# Patient Record
Sex: Female | Born: 1945 | Race: Black or African American | Hispanic: No | Smoking: Former smoker
Health system: Southern US, Community
[De-identification: ages and names within clinical notes are randomized; demographics above are authoritative.]

## PROBLEM LIST (undated history)

## (undated) DIAGNOSIS — L509 Urticaria, unspecified: Secondary | ICD-10-CM

## (undated) DIAGNOSIS — I10 Essential (primary) hypertension: Secondary | ICD-10-CM

## (undated) DIAGNOSIS — K219 Gastro-esophageal reflux disease without esophagitis: Secondary | ICD-10-CM

## (undated) DIAGNOSIS — E7800 Pure hypercholesterolemia, unspecified: Secondary | ICD-10-CM

## (undated) DIAGNOSIS — IMO0001 Reserved for inherently not codable concepts without codable children: Secondary | ICD-10-CM

## (undated) HISTORY — DX: Gastro-esophageal reflux disease without esophagitis: K21.9

## (undated) HISTORY — DX: Essential (primary) hypertension: I10

## (undated) HISTORY — DX: Pure hypercholesterolemia, unspecified: E78.00

## (undated) HISTORY — PX: CHOLECYSTECTOMY: SHX55

## (undated) HISTORY — DX: Urticaria, unspecified: L50.9

## (undated) HISTORY — PX: ABDOMINAL HYSTERECTOMY: SHX81

## (undated) HISTORY — DX: Reserved for inherently not codable concepts without codable children: IMO0001

## (undated) HISTORY — PX: VESICOVAGINAL FISTULA CLOSURE W/ TAH: SUR271

## (undated) HISTORY — PX: CARPAL TUNNEL RELEASE: SHX101

---

## 2009-08-01 ENCOUNTER — Ambulatory Visit: Payer: Self-pay | Admitting: Orthopedic Surgery

## 2009-08-01 DIAGNOSIS — M5430 Sciatica, unspecified side: Secondary | ICD-10-CM | POA: Insufficient documentation

## 2009-08-01 DIAGNOSIS — Z8679 Personal history of other diseases of the circulatory system: Secondary | ICD-10-CM | POA: Insufficient documentation

## 2009-08-01 DIAGNOSIS — S335XXA Sprain of ligaments of lumbar spine, initial encounter: Secondary | ICD-10-CM | POA: Insufficient documentation

## 2010-03-28 NOTE — Letter (Signed)
Summary: History form  History form   Imported By: Jacklynn Ganong 08/15/2009 07:44:35  _____________________________________________________________________  External Attachment:    Type:   Image     Comment:   External Document

## 2010-03-28 NOTE — Assessment & Plan Note (Signed)
Summary: LT HIP PAIN/NUMBNESS/NEEDS XR/CHAMPVA/   Visit Type:  new patient  CC:  numbness in left thigh.  History of Present Illness: I saw Cassie Johnson in the office today for an initial visit.  She is a 65 years old woman with the complaint of:  numbness in left thigh.  The patient picked up a bag of dirt felt pain in her lower LEFT side and back about a month and a half ago and then started having some numbness which is mainly between her back and knee and occasionally into the foot.  The pain is dull is 2/10 it comes and goes it is worse in the morning and after lifting or riding in a car.  No previous history of back problems.  Came on suddenly.  Associated with some numbness and tingling.  The patient had a reaction to prednisone after she was stung by something and had to take it for swelling and ALLERGIC reaction.  She had to be worked up for a racing heart.  Sounds like it was just a increase in steroids which caused a rapid heartbeat.  Xrays today.  Medications: Lisinopril 10mg , Simvastatin 40mg .  Allergies (verified): 1)  ! Prednisone  Past History:  Past Medical History: High blood pressure Reflux High Cholesterol  Past Surgical History: Carpal Tunnel Release bilateral Cholecystectomy T A [facility] and B S [facility]  Review of Systems Constitutional:  Denies weight loss, weight gain, fever, chills, and fatigue. Cardiovascular:  Denies chest pain, palpitations, fainting, and murmurs. Respiratory:  Denies short of breath, wheezing, couch, tightness, pain on inspiration, and snoring . Gastrointestinal:  Complains of heartburn; denies nausea, vomiting, diarrhea, constipation, and blood in your stools. Genitourinary:  Denies frequency, urgency, difficulty urinating, painful urination, flank pain, and bleeding in urine. Neurologic:  Complains of numbness; denies tingling, unsteady gait, dizziness, tremors, and seizure. Musculoskeletal:  Denies joint pain, swelling, instability,  stiffness, redness, heat, and muscle pain. Endocrine:  Denies excessive thirst, exessive urination, and heat or cold intolerance. Psychiatric:  Denies nervousness, depression, anxiety, and hallucinations. Skin:  Denies changes in the skin, poor healing, rash, itching, and redness. HEENT:  Denies blurred or double vision, eye pain, redness, and watering. Immunology:  Complains of seasonal allergies; denies sinus problems and allergic to bee stings. Hemoatologic:  Complains of brusing; denies easy bleeding.  Physical Exam  Skin:  intact without lesions or rashes Inguinal Nodes:  no significant adenopathy Psych:  alert and cooperative; normal mood and affect; normal attention span and concentration   Detailed Back/Spine Exam  General:    Well-developed, well-nourished, in no acute distress; alert and oriented x 3.    Gait:    Normal heel-toe gait pattern bilaterally.    Inspection:    plantigrade foot with normal alignment of leg, ankle, hindfoot, and foot.   Palpation:    tenderness in the LEFT side of the lower of the lower back none on the RIGHT none on the middle  Vascular:    dorsalis pedis and posterior tibial pulses 2+ and symmetric, capillary refill < 2 seconds, normal hair pattern, no evidence of ischemia.   Lumbosacral Exam:  Inspection-deformity:    Normal Palpation-spinal tenderness:  Abnormal Lying Straight Leg Raise:    Right:  negative    Left:  positive Contralateral Straight Leg Raise:    Right:  negative    Left:  negative Toe Walking:    Right:  normal    Left:  normal Heel Walking:    Right:  normal  Left:  normal     motor exam was normal on both leg   Impression & Recommendations:  Problem # 1:  SCIATICA (ICD-724.3)  x-rays of the spine 3 views show mild degenerative changes mild coronal plane abnormality in terms of alignment but nothing acute  Impression sciatica from acute back sprain recommend Neurontin to control the numbness  continue Advil for pain if no improvement after 3 weeks reevaluate for possible MRI  Orders: New Patient Level III (29562) Lumbosacral Spine ,2/3 views (72100)  Medications Added to Medication List This Visit: 1)  Neurontin 100 Mg Caps (Gabapentin) .Marland Kitchen.. 1 at bedtime   may increase to 2  Patient Instructions: 1)  take neurontin 1 at night if no relief increase to 2 tabs at night  2)  Diagnosis: Pinched nerve  3)  return if no improvement after 3 weeks  Prescriptions: NEURONTIN 100 MG CAPS (GABAPENTIN) 1 at bedtime   may increase to 2  #30 x 2   Entered and Authorized by:   Fuller Canada MD   Signed by:   Fuller Canada MD on 08/01/2009   Method used:   Print then Give to Patient   RxID:   [id number]

## 2011-06-06 ENCOUNTER — Ambulatory Visit (INDEPENDENT_AMBULATORY_CARE_PROVIDER_SITE_OTHER): Admitting: Orthopedic Surgery

## 2011-06-06 ENCOUNTER — Encounter: Payer: Self-pay | Admitting: Orthopedic Surgery

## 2011-06-06 VITALS — BP 110/68 | Ht 63.0 in | Wt 161.0 lb

## 2011-06-06 DIAGNOSIS — M5126 Other intervertebral disc displacement, lumbar region: Secondary | ICD-10-CM

## 2011-06-06 MED ORDER — GABAPENTIN 100 MG PO CAPS: 100.0000 mg | ORAL_CAPSULE | Freq: Three times a day (TID) | ORAL | Status: DC

## 2011-06-06 NOTE — Progress Notes (Signed)
Patient ID: Cassie Johnson, female   DOB: 1945-11-03, 66 y.o.   MRN: [id number] Chief Complaint  Patient presents with  . Back Pain    Low back pain. Needs xray.   I last saw Mrs. Haith in 2011 around June and she was diagnosed with sciatica and treated with Neurontin.  Her symptoms began when she picked up a bag of dirt and started having LEFT lower extremity radicular symptoms.  Her pain never completely resolved and now it has progressed and worsened.  She has some back pain hip pain and pain radiating down her LEFT leg associated with numbness.  She has a tired feeling when she is walking in the leg will feel heavy at times.  She does not have evidence of acute spinal stenosis or tumor denies weight loss fever chills or fatigue.  There are no areas of perineal numbness.  Vital signs are stable as recorded  General appearance is normal  The patient is alert and oriented x3  The patient's mood and affect are normal  Gait assessment: No abnormalities in terms of ambulation The cardiovascular exam reveals normal pulses and temperature without edema swelling.  The lymphatic system is negative for palpable lymph nodes  The sensory exam is normal.  There are no pathologic reflexes.  Balance is normal.   Exam of the Lumbar spine  Inspection reveals tenderness in the L5-S1 interspace and also in the LEFT SI joint and LEFT gluteal area.  The range of motion of the spine is limited by pain.  The spine is stable.  Muscle tone is normal.  Bilateral hip range of motion normal without pain or restriction of motion.  Both hips are stable.  Lower extremity strength is normal.  No joint instability or subluxation in the hip knee or ankle in either leg.  Skin is intact in the lower extremities as well as the lumbar spine.  She does have a positive straight leg raise at 60 and a positive Lasegue's sign.  They crossed leg test is normal.   Impression #1 herniated disc lumbar spine  Assessment  the patient has failed oral therapy and physical therapy.  She is ALLERGIC to prednisone had an attack of atrial fibrillation which required a cardiac workup she is not acceptable to steroid by mouth.  Injection of steroid may be an issue as well.  Recommend restarting Neurontin 100 mg t.i.d. And also get an MRI to assess the disc at L5-S1.  Radiographs were taken today and they show degenerative disc disease.  Separate x-ray report AP lateral and spot film of the lumbosacral spine.  The normal lumbar lordosis has been lost.  There is also translation of [place] 12 with anterolisthesis.  There is slight abnormal coronal plane alignment.  There is increased sclerotic bone in the L5-S1 and L4-L5 facet joint.  The overall disc height seemed to be normal.  Impression degenerative disc disease

## 2011-06-07 ENCOUNTER — Telehealth: Payer: Self-pay | Admitting: Orthopedic Surgery

## 2011-06-07 NOTE — Telephone Encounter (Signed)
Contacted [employer] at ph# [phone] re: MRI of lumbar spine.  Per representative Jolene B., on 06/07/11, no pre-authorization is required for MRI under patient's open access plan.  For reference, advised to use her name: Jolene B, 06/07/11; no other reference #.

## 2011-06-11 ENCOUNTER — Telehealth: Payer: Self-pay | Admitting: Radiology

## 2011-06-11 NOTE — Telephone Encounter (Signed)
I called the patient to give her MRI appointment at [facility] on 06-13-11 at 1:30. Patient has [employer], no precert is needed. Patient will follow up here for her results.

## 2011-06-13 ENCOUNTER — Ambulatory Visit (HOSPITAL_COMMUNITY)
Admission: RE | Admit: 2011-06-13 | Discharge: 2011-06-13 | Disposition: A | Discharge status: Home / Self Care | Source: Ambulatory Visit | Attending: Orthopedic Surgery | Admitting: Orthopedic Surgery

## 2011-06-13 DIAGNOSIS — M545 Low back pain, unspecified: Secondary | ICD-10-CM | POA: Insufficient documentation

## 2011-06-13 DIAGNOSIS — M5126 Other intervertebral disc displacement, lumbar region: Secondary | ICD-10-CM | POA: Insufficient documentation

## 2011-06-13 DIAGNOSIS — M47817 Spondylosis without myelopathy or radiculopathy, lumbosacral region: Secondary | ICD-10-CM | POA: Insufficient documentation

## 2011-06-13 DIAGNOSIS — M79609 Pain in unspecified limb: Secondary | ICD-10-CM | POA: Insufficient documentation

## 2011-06-21 ENCOUNTER — Ambulatory Visit (INDEPENDENT_AMBULATORY_CARE_PROVIDER_SITE_OTHER): Admitting: Orthopedic Surgery

## 2011-06-21 ENCOUNTER — Encounter: Payer: Self-pay | Admitting: Orthopedic Surgery

## 2011-06-21 VITALS — BP 118/60 | Ht 63.0 in | Wt 161.0 lb

## 2011-06-21 DIAGNOSIS — M48061 Spinal stenosis, lumbar region without neurogenic claudication: Secondary | ICD-10-CM | POA: Insufficient documentation

## 2011-06-21 NOTE — Patient Instructions (Addendum)
1. Schedule esi @ L4-5   2. Continue advil 3 x a day   3. Schedule PT at [facility]   F/u in 8 weeks   Spinal Stenosis One cause of back pain is spinal stenosis. Stenosis means abnormal narrowing. The spinal canal contains and protects the spinal nerve roots. In spinal stenosis, the spinal canal narrows and pinches the spinal cord and nerves. This causes low back pain and pain in the legs. Stenosis may pinch the nerves that control muscles and sensation in the legs. This leads to pain and abnormal feelings in the leg muscles and areas supplied by those nerves. CAUSES   Spinal stenosis often happens to people as they get older and arthritic boney growths occur in their spinal canal. There is also a loss of the disk height between the bones of the back, which also adds to this problem. Sometimes the problem is present at birth. SYMPTOMS    Pain that is generally worse with activities, particularly standing and walking.   Numbness, tingling, hot or cold feelings, weakness, or a weariness in the legs.   Clumsiness, frequent falling, and a foot-slapping gait, which may come as a result of nerve pressure and muscle weakness.  DIAGNOSIS    Your caregiver may suspect spinal stenosis if you have unusual leg symptoms, such as those previously mentioned.   Your orthopedic surgeon may request special imaging exams, such a computerized magnetic scan (MRI) or computerized X-ray scan (CT) to find out the cause of the problem.  TREATMENT    Sometimes treatments such as postural changes or nonsteroidal anti-inflammatory drugs will relieve the pain.   Nonsteroidal anti-inflammatory medications may help relieve symptoms. These medicines do this by decreasing swelling and inflammation in the nerves.   When stenosis causes severe nerve root compression, conservative treatment may not be enough to maintain a normal lifestyle. Surgery may be recommended to relieve the pressure on affected nerves. In properly  selected patients, the results are very good, and patients are able to continue a normal lifestyle.  HOME CARE INSTRUCTIONS    Flexing the spine by leaning forward while walking may relieve symptoms. Lying with the knees drawn up to the chest may offer some relief. These positions enlarge the space available to the nerves. They may make it easier for stenosis sufferers to walk longer distances.   Rest, followed by gradually resuming activity, also can help.   Aerobic activity, such as bicycling or swimming, is often recommended.   Losing weight can also relieve some of the load on the spine.   Application of warm or cold compresses to the area of pain can be helpful.  SEEK MEDICAL CARE IF:    The periods of relief between episodes of pain become shorter and shorter.   You experience pain that radiates down your leg, even when you are not standing or walking.  SEEK IMMEDIATE MEDICAL CARE IF:    You have a loss of bowel or bladder control.   You have a sudden loss of feeling in your legs.   You suddenly cannot move your legs.  Document Released: 05/05/2003 Document Revised: 02/01/2011 Document Reviewed: 06/30/2009 [facility] Patient Information 2012 [place], [place].

## 2011-06-21 NOTE — Progress Notes (Signed)
Patient ID: Cassie Johnson, female   DOB: 02-11-1946, 66 y.o.   MRN: [id number] Chief Complaint  Patient presents with  . Results    review MRI results of lumbar spine    Review of MRI of the lumbar spine reviewed reveals that the patient has spinal stenosis. The patient has been referred for epidural steroid injection.

## 2011-06-22 ENCOUNTER — Telehealth: Payer: Self-pay | Admitting: Orthopedic Surgery

## 2011-06-22 ENCOUNTER — Encounter: Payer: Self-pay | Admitting: *Deleted

## 2011-06-22 NOTE — Telephone Encounter (Signed)
Cassie Johnson called this morning, said she is having second thoughts about  getting the ESI  Injections.  She thinks she would like to wait and see if the therapy helps before she goes ahead with the injections.  She said she will discuss this with Dr. Ozzie Hoyle office when they call her, as the referral has already been faxed

## 2011-06-22 NOTE — Progress Notes (Signed)
Patient ID: Cassie Johnson, female   DOB: [birth year], 66 y.o.   MRN: [id number] Referral sent to dr Eduard Clos for [facility]

## 2011-08-23 ENCOUNTER — Encounter: Payer: Self-pay | Admitting: Orthopedic Surgery

## 2011-08-23 ENCOUNTER — Ambulatory Visit (INDEPENDENT_AMBULATORY_CARE_PROVIDER_SITE_OTHER): Admitting: Orthopedic Surgery

## 2011-08-23 VITALS — Ht 63.0 in | Wt 161.0 lb

## 2011-08-23 DIAGNOSIS — M48061 Spinal stenosis, lumbar region without neurogenic claudication: Secondary | ICD-10-CM

## 2011-08-23 NOTE — Telephone Encounter (Signed)
Patient saw Dr. Romeo Apple for an appointment today

## 2011-08-23 NOTE — Progress Notes (Signed)
Subjective:     Patient ID: Cassie Johnson, female   DOB: 07-15-1945, 66 y.o.   MRN: [id number]  Chief Complaint  Patient presents with  . Follow-up    8 week recheck on back.    HPI  The patient has disc disease, and spinal stenosis, but she only has pain she walks a distance. She went for epidural and Dr. Clover Mealy, and she decided against epidural and tried a topical cream, which has not worked at this point, she would like to just continue with home exercises. She can tolerate the pain. Review of Systems     Objective:   Physical Exam     Assessment:     Spinal stenosis, stable    Plan:     Epidurals if the patient gets worse

## 2011-08-23 NOTE — Patient Instructions (Addendum)
Call us if you change your mind

## 2013-01-21 ENCOUNTER — Ambulatory Visit (INDEPENDENT_AMBULATORY_CARE_PROVIDER_SITE_OTHER): Payer: Medicare Other | Admitting: Orthopedic Surgery

## 2013-01-21 ENCOUNTER — Encounter: Payer: Self-pay | Admitting: Orthopedic Surgery

## 2013-01-21 ENCOUNTER — Ambulatory Visit (INDEPENDENT_AMBULATORY_CARE_PROVIDER_SITE_OTHER): Payer: Medicare Other

## 2013-01-21 VITALS — BP 142/70 | Ht 63.0 in | Wt 164.0 lb

## 2013-01-21 DIAGNOSIS — M25519 Pain in unspecified shoulder: Secondary | ICD-10-CM

## 2013-01-21 DIAGNOSIS — M129 Arthropathy, unspecified: Secondary | ICD-10-CM

## 2013-01-21 DIAGNOSIS — M1990 Unspecified osteoarthritis, unspecified site: Secondary | ICD-10-CM

## 2013-01-21 DIAGNOSIS — M67919 Unspecified disorder of synovium and tendon, unspecified shoulder: Secondary | ICD-10-CM

## 2013-01-21 DIAGNOSIS — M25511 Pain in right shoulder: Secondary | ICD-10-CM

## 2013-01-21 DIAGNOSIS — M75100 Unspecified rotator cuff tear or rupture of unspecified shoulder, not specified as traumatic: Secondary | ICD-10-CM

## 2013-01-21 MED ORDER — NABUMETONE 500 MG PO TABS: 500.0000 mg | ORAL_TABLET | Freq: Two times a day (BID) | ORAL | Status: DC

## 2013-01-21 NOTE — Patient Instructions (Signed)
You have received a steroid shot. 15% of patients experience increased pain at the injection site with in the next 24 hours. This is best treated with ice and tylenol extra strength 2 tabs every 8 hours. If you are still having pain please call the office.   Start relafen 500 mg twice a day

## 2013-01-21 NOTE — Progress Notes (Signed)
Patient ID: Cassie Johnson, female   DOB: 1946/01/08, 67 y.o.   MRN: [id number]   Chief Complaint  Patient presents with  . Shoulder Pain    Bilateral shoulder pain    HISTORY: 67 years old bilateral shoulder pain decreased range of motion started 4 weeks ago who had previous injections back in the 80s at him several times. Pain is sharp stabbing 9/10 hurts with exercise and everyday movements hurts at night sometimes relieved with Advil worse when she's sleeping. Reports a catching sensation. She says that a month ago she was fine and she can lift her arms over her head were performed normal bathing activity or comb her hair  Review of systems negative except for seasonal allergies  Allergic to prednisone  Medical problems include hypertension she had a hysterectomy cholecystectomy bilateral carpal tunnel release  She is on lisinopril  Her family history is negative for social history reveals that she is widowed does not smoke or drink alcohol  Vital signs BP 142/70  Ht 5\' 3"  (1.6 m)  Wt 164 lb (74.39 kg)  BMI 29.06 kg/m2   General appearance: Development, nutrition are normal. Body habitus medium No gross deformities are noted and grooming normal.  Peripheral vascular system no swelling or varicose veins are noted and pulses are palpable without tenderness, temperature warm to touch no edema.  No palpable lymph nodes are noted in the cervical area or axillae.  The skin overlying the right and left shoulder cervical and thoracic spine is normal without rash, lesion or ulceration  Deep tendon reflexes are normal and equal. And pathologic reflexes such as Hoffman sign are negative.  Sensation remains normal.  The patient is oriented to person place and time, the mood and affect are normal  Ambulation remains normal  Cervical spine no mass or tenderness. Range of motion is normal. Muscle tone is normal. Skin is normal.  Right shoulder inspection reveals . Acromial and  subdeltoid tenderness. No crepitation. Limited range of motion in all planes. Stability test cannot be performed internal and external rotation strength is normal but painful for elevation and external rotation without limitation in terms of stiffness   Left shoulder  inspection reveals similar findings to the right shoulder acromial and subdeltoid tenderness without crepitation. Limited range of motion in all planes without restriction no area stability test cannot be performed internal and external rotation strength was normal she had painful for elevation.  She was also complaining of some neck symptoms with the X-ray shows significant degenerative arthrosis but this appears to be secondary  Bilateral shoulder injections for bursitis cannot rule out rotator cuff tear recommend a followup visit  Subacromial Shoulder Injection Procedure Note  Pre-operative Diagnosis: left RC Syndrome  Post-operative Diagnosis: same  Indications: pain   Anesthesia: ethyl chloride   Procedure Details   Verbal consent was obtained for the procedure. The shoulder was prepped withalcohol and the skin was anesthetized. A 20 gauge needle was advanced into the subacromial space through posterior approach without difficulty  The space was then injected with 3 ml 1% lidocaine and 1 ml of depomedrol. The injection site was cleansed with isopropyl alcohol and a dressing was applied.  Complications:  None; patient tolerated the procedure well.  Shoulder Injection Procedure Note   Pre-operative Diagnosis: right  RC Syndrome  Post-operative Diagnosis: same  Indications: pain   Anesthesia: ethyl chloride   Procedure Details   Verbal consent was obtained for the procedure. The shoulder was prepped withalcohol and the  skin was anesthetized. A 20 gauge needle was advanced into the subacromial space through posterior approach without difficulty  The space was then injected with 3 ml 1% lidocaine and 1 ml of  depomedrol. The injection site was cleansed with isopropyl alcohol and a dressing was applied.  Complications:  None; patient tolerated the procedure well.

## 2013-03-05 ENCOUNTER — Ambulatory Visit (INDEPENDENT_AMBULATORY_CARE_PROVIDER_SITE_OTHER): Payer: Medicare Other | Admitting: Orthopedic Surgery

## 2013-03-05 ENCOUNTER — Ambulatory Visit (INDEPENDENT_AMBULATORY_CARE_PROVIDER_SITE_OTHER): Payer: Medicare Other

## 2013-03-05 VITALS — BP 143/72 | Ht 63.0 in | Wt 164.0 lb

## 2013-03-05 DIAGNOSIS — M67919 Unspecified disorder of synovium and tendon, unspecified shoulder: Secondary | ICD-10-CM

## 2013-03-05 DIAGNOSIS — M25511 Pain in right shoulder: Secondary | ICD-10-CM

## 2013-03-05 DIAGNOSIS — M25512 Pain in left shoulder: Principal | ICD-10-CM

## 2013-03-05 DIAGNOSIS — M25519 Pain in unspecified shoulder: Secondary | ICD-10-CM

## 2013-03-05 DIAGNOSIS — M719 Bursopathy, unspecified: Secondary | ICD-10-CM

## 2013-03-05 DIAGNOSIS — M75100 Unspecified rotator cuff tear or rupture of unspecified shoulder, not specified as traumatic: Secondary | ICD-10-CM

## 2013-03-05 NOTE — Patient Instructions (Signed)
CALL TO ARRANGE THERAPY REFERRAL TO DR. BRADFORD LEFT SHOULDER LIPOMA

## 2013-03-05 NOTE — Progress Notes (Signed)
Patient ID: Cassie Johnson, female   DOB: 03/11/1945, 68 y.o.   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 The left shoulder improve the right shoulder she still has pain at night pain with lying on her right side and pain with forward elevation  Review of systems her neck x-rays show degenerative disc disease she has mild neck pain denies numbness or tingling to his are to have bilateral carpal tunnel releases and is asymptomatic  Clinical exam shows a well-developed well-nourished female oriented x3 mood and affect normal she has some tenderness around the right shoulder painful for elevation with positive impingement sign decreased internal rotation normal external rotation normal passive flexion motor exam intact  Left shoulder anterior subcutaneous mass consistent with lipoma approximately 3 x 4 cm  Plan #1 physical therapy for both shoulders #2 referral to Dr. Romona Curls for lipoma excision  Followup as needed

## 2013-03-09 ENCOUNTER — Other Ambulatory Visit: Payer: Self-pay | Admitting: *Deleted

## 2013-03-09 ENCOUNTER — Telehealth: Payer: Self-pay | Admitting: *Deleted

## 2013-03-09 DIAGNOSIS — D179 Benign lipomatous neoplasm, unspecified: Secondary | ICD-10-CM

## 2013-03-09 NOTE — Telephone Encounter (Signed)
Faxed office notes to Dr. Romona Curls. Patient has appointment 03/10/13 at 2:30 pm. Patient is aware of appointment date and time.

## 2013-03-10 ENCOUNTER — Other Ambulatory Visit (HOSPITAL_COMMUNITY)
Admission: RE | Admit: 2013-03-10 | Discharge: 2013-03-10 | Disposition: A | Discharge status: Home / Self Care | Payer: Medicare Other | Source: Ambulatory Visit | Attending: General Surgery | Admitting: General Surgery

## 2013-03-10 DIAGNOSIS — D2110 Benign neoplasm of connective and other soft tissue of unspecified upper limb, including shoulder: Secondary | ICD-10-CM | POA: Insufficient documentation

## 2014-03-31 ENCOUNTER — Other Ambulatory Visit (HOSPITAL_COMMUNITY): Payer: Self-pay | Admitting: Neurosurgery

## 2014-03-31 DIAGNOSIS — M48061 Spinal stenosis, lumbar region without neurogenic claudication: Secondary | ICD-10-CM

## 2014-04-08 ENCOUNTER — Ambulatory Visit (HOSPITAL_COMMUNITY)
Admission: RE | Admit: 2014-04-08 | Discharge: 2014-04-08 | Disposition: A | Discharge status: Home / Self Care | Payer: Medicare Other | Source: Ambulatory Visit | Attending: Neurosurgery | Admitting: Neurosurgery

## 2014-04-08 DIAGNOSIS — M48061 Spinal stenosis, lumbar region without neurogenic claudication: Secondary | ICD-10-CM

## 2014-04-08 DIAGNOSIS — G8929 Other chronic pain: Secondary | ICD-10-CM | POA: Insufficient documentation

## 2014-04-08 DIAGNOSIS — M545 Low back pain: Secondary | ICD-10-CM | POA: Diagnosis present

## 2014-10-26 ENCOUNTER — Ambulatory Visit (HOSPITAL_COMMUNITY): Payer: Medicare Other | Attending: Neurology | Admitting: Physical Therapy

## 2014-10-26 DIAGNOSIS — G5702 Lesion of sciatic nerve, left lower limb: Secondary | ICD-10-CM | POA: Insufficient documentation

## 2014-10-26 DIAGNOSIS — M533 Sacrococcygeal disorders, not elsewhere classified: Secondary | ICD-10-CM | POA: Diagnosis present

## 2014-10-26 DIAGNOSIS — R262 Difficulty in walking, not elsewhere classified: Secondary | ICD-10-CM | POA: Diagnosis present

## 2014-10-26 DIAGNOSIS — R29898 Other symptoms and signs involving the musculoskeletal system: Secondary | ICD-10-CM | POA: Diagnosis present

## 2014-10-26 NOTE — Patient Instructions (Signed)
Piriformis Stretch, Sitting   Sit, one ankle on opposite knee, same-side hand on crossed knee. Push down on knee, keeping spine straight. Lean torso forward, with flat back, until tension is felt in hamstrings and gluteals of crossed-leg side. Hold __30_ seconds.  Repeat __3_ times per session. Do _2__ sessions per day.  Copyright  VHI. All rights reserved.  Bridging   Slowly raise buttocks from floor, keeping stomach tight. Repeat __15__ times per set. Do __1__ sets per session. Do __2__ sessions per day.  http://orth.exer.us/1097   Copyright  VHI. All rights reserved.  Strengthening: Hip Abduction (Side-Lying)   Tighten muscles on front of left thigh, then lift leg __15-18__ inches from surface, keeping knee locked.  Repeat __10__ times per set. Do __1__ sets per session. Do __2__ sessions per day.  http://orth.exer.us/622   Copyright  VHI. All rights reserved.

## 2014-10-26 NOTE — Therapy (Signed)
[place], [place], [address] Phone: [phone]   Fax:  [phone]  Physical Therapy Evaluation  Patient Details  Name: Cassie Johnson MRN: [id number] Date of Birth: 1945-06-25 Referring Provider:  Phillips Odor, MD  Encounter Date: 10/26/2014      PT End of Session - 10/26/14 1211    Visit Number 1   Number of Visits 10   Date for PT Re-Evaluation 11/23/14   Authorization Type Medicare   Authorization - Visit Number 1   Authorization - Number of Visits 10   PT Start Time 0922   PT Stop Time 1012   PT Time Calculation (min) 50 min   Activity Tolerance Patient tolerated treatment well   Behavior During Therapy [facility] for tasks assessed/performed      Past Medical History  Diagnosis Date  . Reflux   . High blood pressure   . High cholesterol     Past Surgical History  Procedure Laterality Date  . Carpal tunnel release    . Vesicovaginal fistula closure w/ tah    . Cholecystectomy      There were no vitals filed for this visit.  Visit Diagnosis:  Sacroiliac joint pain  Difficulty walking  Piriformis syndrome of left side  Weakness of left leg      Subjective Assessment - 10/26/14 0925    Subjective Pt reports that she has been having pain in L SI region for the past 3 years. She reports that she has pain that travels down the back of her leg, and numbness on the front of her L leg. She states that she mainly gets the numbness when she is on her feet for long periods of time, but the pain in her low back/SI is always present. Pt describes the pain as an achiness in her SI region, with sharp shooting pain at times down the back of her leg. She reports that if she shifts her weight to her R side in sitting, she can get some relief, and she has to sleep on her R side due to increased pain when in L sidelying.  Pt received cortisone injection in her tailbone on 8/12, which did not help her and she feels  that it may have made her worse.  She denies having any back pain or MOI.   How long can you sit comfortably? immediately gets pain   How long can you stand comfortably? 10 minutes   How long can you walk comfortably? one hour with rest breaks   Patient Stated Goals Decrease pain, be able to walk without rest break.    Currently in Pain? Yes   Pain Score 5    Pain Location Buttocks   Pain Orientation Left   Pain Descriptors / Indicators Aching            [facility] PT Assessment - 10/26/14 0001    Assessment   Medical Diagnosis SI joint pain   Next MD Visit none scheduled   Balance Screen   Has the patient fallen in the past 6 months No   Has the patient had a decrease in activity level because of a fear of falling?  No   Is the patient reluctant to leave their home because of a fear of falling?  No   Home Environment   Living Environment Private residence   Living Arrangements Alone   Type of [place] to enter   Entrance  Stairs-Number of Steps 1   Home Layout One level   Prior Function   Level of Independence Independent   Vocation Retired   Leisure walks 3 miles 3x/wk, bicycling, cooking   Observation/Other Assessments   Focus on Therapeutic Outcomes (FOTO)  41% limited   ROM / Strength   AROM / PROM / Strength AROM;PROM;Strength   AROM   AROM Assessment Site Lumbar   Lumbar Flexion 95   Lumbar Extension 36   Lumbar - Right Side Bend 28   Lumbar - Left Side Bend 29   PROM   PROM Assessment Site Hip   Right/Left Hip Right;Left   Right Hip External Rotation  40   Right Hip Internal Rotation  43   Left Hip External Rotation  40   Left Hip Internal Rotation  38   Strength   Strength Assessment Site Hip;Knee   Right/Left Hip Right;Left   Right Hip Flexion 4+/5   Right Hip Extension 4/5   Right Hip ABduction 4/5   Left Hip Flexion 4/5   Left Hip Extension 3/5   Left Hip ABduction 4-/5   Right/Left Knee Right;Left   Right Knee Flexion 4+/5    Right Knee Extension 4+/5   Left Knee Flexion 4/5   Left Knee Extension 4+/5   Flexibility   Soft Tissue Assessment /Muscle Length yes   Piriformis decreased on L    Palpation   SI assessment  Pain with anterior and posterior rotation of innominate, anterior>posterior   Palpation comment Tenderness with palpation over L piriformis   Special Tests    Special Tests Sacrolliac Tests;Hip Special Tests;Lumbar   Lumbar Tests Straight Leg Raise   Sacroiliac Tests  Pelvic Compression   Hip Special Tests  Saralyn Pilar (FABER) Test   Straight Leg Raise   Comment 49 degrees on L   Pelvic Dictraction   Findings Negative   Side  Left   Pelvic Compression   Findings Positive   Side Left   Saralyn Pilar (FABER) Test   Findings Positive   Side Left                   [facility] Adult PT Treatment/Exercise - 10/26/14 0001    Manual Therapy   Manual Therapy Soft tissue mobilization   Soft tissue mobilization STM to L piriformis                PT Education - 10/26/14 1211    Education provided Yes   Education Details Impairments, POC, HEP prescribed   Person(s) Educated Patient   Methods Explanation;Handout   Comprehension Verbalized understanding          PT Short Term Goals - 10/26/14 1341    PT SHORT TERM GOAL #1   Title Pt will be independent with HEP.    Time 2   Period Weeks   Status New   PT SHORT TERM GOAL #2   Title Improve L hip abduction strength to 4/5 or greater to improve stability of SI joint.    Time 2   Period Weeks   Status New   PT SHORT TERM GOAL #3   Title Improve R hip extension strength to 4-/5 to improve ability to climb stairs and walk up hills without pain.    Time 2   Period Weeks   Status New           PT Long Term Goals - 10/26/14 1343    PT LONG TERM GOAL #1   Title  Pt will be independent with advanced HEP for core and SI stabilization.    Time 4   Period Weeks   Status New   PT LONG TERM GOAL #2   Title Improve hip abduction and  extension strength to 4+/5 or greater to improve stability of SI and decrease piriformis pain to allow pt to ambulate without pain.    Time 4   Period Weeks   Status New   PT LONG TERM GOAL #3   Title SI provocation tests (ASIS gapping, FABER, and SLR) will all be negative to demonstrate proper mechanics of SI to allow pt to climb stairs and walk without pain.    Time 4   Period Weeks   Status New   PT LONG TERM GOAL #4   Title Pt will demonstrate equal length of piriformis bilaterally and no pain with palpation of piriformis muscle belly to return pt to PLOF without pain.   Time 4   Period Weeks   Status New               Plan - 11-09-14 1212    Clinical Impression Statement Pt presents to PT with c/o pain in her L buttock, pain in LLE, and decreased functional activity tolerance. Pt demonstrates decreased strength of LLE, positive SI provocation tests, and tightness and tenderness in L piriformis which is limiting her ability to complete PLOF activities such as walking 3 miles 3x per week or riding her bicycle. Treatment will focus on manual therapy to L pifiromis to decrease SI joint pain, SI joint mobilizations, core and hip strengthening to improve mechanics of SI, and functional activity training. Pt will benefit from skilled physical therapy to address her impairments and decrease pain in L piriformis/SI to allow her to return to PLOF.    Pt will benefit from skilled therapeutic intervention in order to improve on the following deficits Abnormal gait;Decreased activity tolerance;Decreased strength;Difficulty walking;Increased muscle spasms;Pain   Rehab Potential Good   PT Frequency 2x / week   PT Duration 4 weeks  4-6 weeks   PT Treatment/Interventions ADLs/Self Care Home Management;Moist Heat;Gait training;Stair training;Functional mobility training;Therapeutic activities;Therapeutic exercise;Neuromuscular re-education;Patient/family education;Manual techniques   PT Next Visit  Plan Continue with manual to L piriformis, L SI joint. Begin core strengthening and hip abduction strengthening   PT Home Exercise Plan Given          G-Codes - 11-09-14 1441    Functional Assessment Tool Used FOTO   Functional Limitation Changing and maintaining body position   Changing and Maintaining Body Position Current Status (B1478) At least 40 percent but less than 60 percent impaired, limited or restricted   Changing and Maintaining Body Position Goal Status (G9562) At least 20 percent but less than 40 percent impaired, limited or restricted       Problem List Patient Active Problem List   Diagnosis Date Noted  . Spinal stenosis of lumbar region 06/21/2011  . Lumbar herniated disc 06/06/2011  . SCIATICA 08/01/2009  . LUMBAR SPRAIN AND STRAIN 08/01/2009  . HIGH BLOOD PRESSURE 08/01/2009    Hilma Favors, PT, DPT [phone] 11/09/2014, 2:43 PM  [facility] [address] [place], [place], [address] Phone: [phone]   Fax:  [phone]

## 2014-10-27 ENCOUNTER — Ambulatory Visit (HOSPITAL_COMMUNITY): Payer: Medicare Other | Admitting: Physical Therapy

## 2014-10-27 DIAGNOSIS — R262 Difficulty in walking, not elsewhere classified: Secondary | ICD-10-CM

## 2014-10-27 DIAGNOSIS — M533 Sacrococcygeal disorders, not elsewhere classified: Secondary | ICD-10-CM

## 2014-10-27 DIAGNOSIS — G5702 Lesion of sciatic nerve, left lower limb: Secondary | ICD-10-CM

## 2014-10-27 DIAGNOSIS — R29898 Other symptoms and signs involving the musculoskeletal system: Secondary | ICD-10-CM

## 2014-10-27 NOTE — Patient Instructions (Signed)
Straight Leg Raise   Tighten stomach and slowly raise locked right leg __10__ inches from floor.  Repeat __10__ times per set.  Do __2__ sessions per day.    Hamstring Stretch (Standing)   Standing, place one heel on chair or bench. Use one or both hands on thigh for support. Keeping torso straight, lean forward slowly until a stretch is felt in back of same thigh. Hold _30___ seconds. Repeat with other leg.    Calf Stretch   Stand with hands supported on wall, elbows slightly bent, front knee bent, back knee straight, feet parallel and both heels on floor. Lean into wall by pushing hips forward until a stretch is felt in calf muscle. Hold _30___ seconds. Repeat with leg positions switched.

## 2014-10-27 NOTE — Therapy (Signed)
[facility] [address] [place], [place], [address] Phone: [phone]   Fax:  [phone]  Physical Therapy Treatment  Patient Details  Name: Cassie Johnson MRN: [id number] Date of Birth: 08-Aug-1945 Referring Provider:  Phillips Odor, MD  Encounter Date: 10/27/2014      PT End of Session - 10/27/14 1526    Visit Number 2   Number of Visits 10   Date for PT Re-Evaluation 11/23/14   Authorization Type Medicare   Authorization - Visit Number 2   Authorization - Number of Visits 10   PT Start Time 0930   PT Stop Time 1015   PT Time Calculation (min) 45 min   Activity Tolerance Patient tolerated treatment well   Behavior During Therapy [facility] for tasks assessed/performed      Past Medical History  Diagnosis Date  . Reflux   . High blood pressure   . High cholesterol     Past Surgical History  Procedure Laterality Date  . Carpal tunnel release    . Vesicovaginal fistula closure w/ tah    . Cholecystectomy      There were no vitals filed for this visit.  Visit Diagnosis:  Sacroiliac joint pain  Difficulty walking  Piriformis syndrome of left side  Weakness of left leg      Subjective Assessment - 10/27/14 1015    Subjective Pt states she felt better after last session.  Enters today with mild pain in Lt SI region. 2/10   Currently in Pain? Yes   Pain Score 2    Pain Location Sacrum   Pain Orientation Left                         [facility] Adult PT Treatment/Exercise - 10/27/14 1011    Knee/Hip Exercises: Stretches   Active Hamstring Stretch Both;3 reps;30 seconds   Active Hamstring Stretch Limitations 12" step   Piriformis Stretch Both;3 reps;30 seconds   Piriformis Stretch Limitations seated   Gastroc Stretch 3 reps;30 seconds   Gastroc Stretch Limitations slant board   Knee/Hip Exercises: Aerobic   Tread Mill 5' at 1.0 3% incline following MET   Nustep 8 minutes level 2 hills #2 LE only   Knee/Hip Exercises: Supine   Bridges Both;10 reps   Straight Leg Raises Both;10 reps   Knee/Hip Exercises: Sidelying   Hip ABduction Both;10 reps   Manual Therapy   Manual Therapy Muscle Energy Technique   Muscle Energy Technique Lt anterior SI rotation correction in supine                PT Education - 10/27/14 1014    Education provided Yes   Education Details copy of initial evaluation given with explanation of goals and measurements.  Updated HEP with SLR, hamstring and gastroc stretch.  Education on SI dysfunction and mechanism of MET in correcting.    Person(s) Educated Patient   Methods Explanation;Demonstration;Handout   Comprehension Verbalized understanding;Need further instruction;Returned demonstration          PT Short Term Goals - 10/26/14 1341    PT SHORT TERM GOAL #1   Title Pt will be independent with HEP.    Time 2   Period Weeks   Status New   PT SHORT TERM GOAL #2   Title Improve L hip abduction strength to 4/5 or greater to improve stability of SI joint.    Time 2   Period Weeks  Status New   PT SHORT TERM GOAL #3   Title Improve R hip extension strength to 4-/5 to improve ability to climb stairs and walk up hills without pain.    Time 2   Period Weeks   Status New           PT Long Term Goals - 10/26/14 1343    PT LONG TERM GOAL #1   Title Pt will be independent with advanced HEP for core and SI stabilization.    Time 4   Period Weeks   Status New   PT LONG TERM GOAL #2   Title Improve hip abduction and extension strength to 4+/5 or greater to improve stability of SI and decrease piriformis pain to allow pt to ambulate without pain.    Time 4   Period Weeks   Status New   PT LONG TERM GOAL #3   Title SI provocation tests (ASIS gapping, FABER, and SLR) will all be negative to demonstrate proper mechanics of SI to allow pt to climb stairs and walk without pain.    Time 4   Period Weeks   Status New   PT LONG TERM GOAL #4    Title Pt will demonstrate equal length of piriformis bilaterally and no pain with palpation of piriformis muscle belly to return pt to PLOF without pain.   Time 4   Period Weeks   Status New               Plan - 10/27/14 1520    Clinical Impression Statement Focused session on improving LE flexibility and progressing with HEP.    Added supine SLR, hamstring and gastroc stretches.  Pt with noted improvment at end of session with decresaed pain and improved ambulation.  Given updated HEP and copy of evaluation with explanation of measurements and goals.  Manual to piriformis not completed today, however did complete MET for Lt SI anterior rotation.   PT Duration --  4-6 weeks   PT Next Visit Plan Continue with manual to L piriformis as needed.  Check alignment next session and progress stabiliy and strengthening exercises.    PT Home Exercise Plan Given       Problem List Patient Active Problem List   Diagnosis Date Noted  . Spinal stenosis of lumbar region 06/21/2011  . Lumbar herniated disc 06/06/2011  . SCIATICA 08/01/2009  . LUMBAR SPRAIN AND STRAIN 08/01/2009  . HIGH BLOOD PRESSURE 08/01/2009    Teena Irani, PTA/CLT [phone]  10/27/2014, 3:30 PM  [facility] [address] [place], [place], [address] Phone: [phone]   Fax:  [phone]

## 2014-11-02 ENCOUNTER — Ambulatory Visit (HOSPITAL_COMMUNITY): Payer: Medicare Other | Attending: Neurology | Admitting: Physical Therapy

## 2014-11-02 DIAGNOSIS — G5702 Lesion of sciatic nerve, left lower limb: Secondary | ICD-10-CM | POA: Insufficient documentation

## 2014-11-02 DIAGNOSIS — M533 Sacrococcygeal disorders, not elsewhere classified: Secondary | ICD-10-CM | POA: Insufficient documentation

## 2014-11-02 DIAGNOSIS — R29898 Other symptoms and signs involving the musculoskeletal system: Secondary | ICD-10-CM | POA: Insufficient documentation

## 2014-11-02 DIAGNOSIS — R262 Difficulty in walking, not elsewhere classified: Secondary | ICD-10-CM | POA: Insufficient documentation

## 2014-11-02 NOTE — Therapy (Signed)
[place], [place], [address] Phone: [phone]   Fax:  [phone]  Physical Therapy Treatment  Patient Details  Name: Cassie Johnson MRN: [id number] Date of Birth: 09-18-1945 Referring Provider:  Phillips Odor, MD  Encounter Date: 11/02/2014      PT End of Session - 11/02/14 1129    Visit Number 3   Number of Visits 10   Date for PT Re-Evaluation 11/23/14   Authorization Type Medicare   Authorization - Visit Number 3   Authorization - Number of Visits 10   PT Start Time 1100   PT Stop Time 1140   PT Time Calculation (min) 40 min   Activity Tolerance Patient tolerated treatment well   Behavior During Therapy [facility] for tasks assessed/performed      Past Medical History  Diagnosis Date  . Reflux   . High blood pressure   . High cholesterol     Past Surgical History  Procedure Laterality Date  . Carpal tunnel release    . Vesicovaginal fistula closure w/ tah    . Cholecystectomy      There were no vitals filed for this visit.  Visit Diagnosis:  Sacroiliac joint pain  Difficulty walking  Piriformis syndrome of left side  Weakness of left leg      Subjective Assessment - 11/02/14 1132    Subjective PT states she is getting better with only minimal discomfort.  Compliant with HEP   Currently in Pain? Yes   Pain Score 1    Pain Location Sacrum   Pain Orientation Left                         [facility] Adult PT Treatment/Exercise - 11/02/14 1100    Knee/Hip Exercises: Stretches   Active Hamstring Stretch Both;30 seconds;2 reps   Active Hamstring Stretch Limitations 12" step   Piriformis Stretch Both;30 seconds;2 reps   Piriformis Stretch Limitations seated   Gastroc Stretch 3 reps;30 seconds   Gastroc Stretch Limitations slant board   Knee/Hip Exercises: Aerobic   Nustep 10 minutes level 3 hills #2 LE only   Knee/Hip Exercises: Standing   Heel Raises Both;10 reps   Heel  Raises Limitations toeraises 10 reps   Hip Abduction Both;10 reps   Hip Extension Both;10 reps   Knee/Hip Exercises: Supine   Bridges Both;10 reps   Straight Leg Raises Both;10 reps   Knee/Hip Exercises: Sidelying   Hip ABduction Both;10 reps   Knee/Hip Exercises: Prone   Hamstring Curl 10 reps   Hip Extension Both;10 reps                  PT Short Term Goals - 10/26/14 1341    PT SHORT TERM GOAL #1   Title Pt will be independent with HEP.    Time 2   Period Weeks   Status New   PT SHORT TERM GOAL #2   Title Improve L hip abduction strength to 4/5 or greater to improve stability of SI joint.    Time 2   Period Weeks   Status New   PT SHORT TERM GOAL #3   Title Improve R hip extension strength to 4-/5 to improve ability to climb stairs and walk up hills without pain.    Time 2   Period Weeks   Status New           PT Long Term Goals - 10/26/14 1343  PT LONG TERM GOAL #1   Title Pt will be independent with advanced HEP for core and SI stabilization.    Time 4   Period Weeks   Status New   PT LONG TERM GOAL #2   Title Improve hip abduction and extension strength to 4+/5 or greater to improve stability of SI and decrease piriformis pain to allow pt to ambulate without pain.    Time 4   Period Weeks   Status New   PT LONG TERM GOAL #3   Title SI provocation tests (ASIS gapping, FABER, and SLR) will all be negative to demonstrate proper mechanics of SI to allow pt to climb stairs and walk without pain.    Time 4   Period Weeks   Status New   PT LONG TERM GOAL #4   Title Pt will demonstrate equal length of piriformis bilaterally and no pain with palpation of piriformis muscle belly to return pt to PLOF without pain.   Time 4   Period Weeks   Status New               Plan - 11/02/14 1129    Clinical Impression Statement Progressed with standing hip ext/abduction exercises as well as prone stab ex.  Pt able to complete without diffiuculty or  pain.  Verbal cues to complete ex more slowly/controlled.  SI assesed and in good alignment today without need of MET.  Pt comlpliant with HEP and overall improving.    PT Duration --  4-6 weeks   PT Next Visit Plan Continue with manual to L piriformis as needed.  Check alignment each session and progress stabiliy and strengthening exercises.  Add SLS, vector stance and forward lunges onto riser next session.     PT Home Exercise Plan Given        Problem List Patient Active Problem List   Diagnosis Date Noted  . Spinal stenosis of lumbar region 06/21/2011  . Lumbar herniated disc 06/06/2011  . SCIATICA 08/01/2009  . LUMBAR SPRAIN AND STRAIN 08/01/2009  . HIGH BLOOD PRESSURE 08/01/2009   Teena Irani, PTA/CLT [phone] 11/02/2014, 11:33 AM  [facility] [address] [place], [place], [address] Phone: [phone]   Fax:  [phone]

## 2014-11-04 ENCOUNTER — Ambulatory Visit (HOSPITAL_COMMUNITY): Payer: Medicare Other | Admitting: Physical Therapy

## 2014-11-04 DIAGNOSIS — R262 Difficulty in walking, not elsewhere classified: Secondary | ICD-10-CM

## 2014-11-04 DIAGNOSIS — M533 Sacrococcygeal disorders, not elsewhere classified: Secondary | ICD-10-CM | POA: Diagnosis not present

## 2014-11-04 DIAGNOSIS — R29898 Other symptoms and signs involving the musculoskeletal system: Secondary | ICD-10-CM

## 2014-11-04 DIAGNOSIS — G5702 Lesion of sciatic nerve, left lower limb: Secondary | ICD-10-CM

## 2014-11-04 NOTE — Therapy (Signed)
[facility] [address] [place], [place], [address] Phone: [phone]   Fax:  [phone]  Physical Therapy Treatment  Patient Details  Name: Cassie Johnson MRN: [id number] Date of Birth: December 10, 1945 Referring Provider:  Phillips Odor, MD  Encounter Date: 11/04/2014      PT End of Session - 11/04/14 1010    Visit Number 4   Number of Visits 10   Date for PT Re-Evaluation 11/23/14   Authorization Type Medicare   Authorization - Visit Number 4   Authorization - Number of Visits 10   PT Start Time [phone]   PT Stop Time 1020   PT Time Calculation (min) 44 min   Activity Tolerance Patient tolerated treatment well   Behavior During Therapy [facility] for tasks assessed/performed      Past Medical History  Diagnosis Date  . Reflux   . High blood pressure   . High cholesterol     Past Surgical History  Procedure Laterality Date  . Carpal tunnel release    . Vesicovaginal fistula closure w/ tah    . Cholecystectomy      There were no vitals filed for this visit.  Visit Diagnosis:  Sacroiliac joint pain  Difficulty walking  Piriformis syndrome of left side  Weakness of left leg      Subjective Assessment - 11/04/14 1017    Subjective PT reports she is still doing well.  Walking 3X week for approx 2 miles.     Currently in Pain? No/denies                         [facility] Adult PT Treatment/Exercise - 11/04/14 0943    Knee/Hip Exercises: Stretches   Active Hamstring Stretch Both;30 seconds;2 reps   Active Hamstring Stretch Limitations 12" step   Gastroc Stretch 3 reps;30 seconds   Gastroc Stretch Limitations slant board   Knee/Hip Exercises: Aerobic   Nustep 10 minutes level 3 hills #3 LE only   Knee/Hip Exercises: Standing   Heel Raises Both;15 reps   Heel Raises Limitations toeraises 15 reps   SLS max Lt: 47"  Rt: 37"   SLS with Vectors 5X5" each with 1 HHA   Knee/Hip Exercises: Supine   Bridges Both;15  reps   Straight Leg Raises Both;15 reps   Knee/Hip Exercises: Sidelying   Hip ABduction 15 reps   Knee/Hip Exercises: Prone   Hamstring Curl 15 reps   Hip Extension Both;15 reps                  PT Short Term Goals - 10/26/14 1341    PT SHORT TERM GOAL #1   Title Pt will be independent with HEP.    Time 2   Period Weeks   Status New   PT SHORT TERM GOAL #2   Title Improve L hip abduction strength to 4/5 or greater to improve stability of SI joint.    Time 2   Period Weeks   Status New   PT SHORT TERM GOAL #3   Title Improve R hip extension strength to 4-/5 to improve ability to climb stairs and walk up hills without pain.    Time 2   Period Weeks   Status New           PT Long Term Goals - 10/26/14 1343    PT LONG TERM GOAL #1   Title Pt will be independent with advanced HEP for  core and SI stabilization.    Time 4   Period Weeks   Status New   PT LONG TERM GOAL #2   Title Improve hip abduction and extension strength to 4+/5 or greater to improve stability of SI and decrease piriformis pain to allow pt to ambulate without pain.    Time 4   Period Weeks   Status New   PT LONG TERM GOAL #3   Title SI provocation tests (ASIS gapping, FABER, and SLR) will all be negative to demonstrate proper mechanics of SI to allow pt to climb stairs and walk without pain.    Time 4   Period Weeks   Status New   PT LONG TERM GOAL #4   Title Pt will demonstrate equal length of piriformis bilaterally and no pain with palpation of piriformis muscle belly to return pt to PLOF without pain.   Time 4   Period Weeks   Status New               Plan - 11/04/14 1011    Clinical Impression Statement Progressed balance and stability exercises adding SLS and vector stance.  Pt with noted fatigue in glute but able to sustain contraction more than 30 seconds.  PT with improved control and speed with therex today . Again, SI was in good alignment withotu need for MET.  Pt   states she is walkign approx 1 hour 3X week for exercise.    PT Duration --  4-6 weeks   PT Next Visit Plan Continue with manual to L piriformis as needed.  Check alignment each session and progress stabiliy and strengthening exercises.  Add lunges onto riser next session.     PT Home Exercise Plan Given        Problem List Patient Active Problem List   Diagnosis Date Noted  . Spinal stenosis of lumbar region 06/21/2011  . Lumbar herniated disc 06/06/2011  . SCIATICA 08/01/2009  . LUMBAR SPRAIN AND STRAIN 08/01/2009  . HIGH BLOOD PRESSURE 08/01/2009    Teena Irani, PTA/CLT [phone]  11/04/2014, 10:18 AM  [facility], [place], [address] Phone: [phone]   Fax:  [phone]

## 2014-11-09 ENCOUNTER — Ambulatory Visit (HOSPITAL_COMMUNITY): Payer: Medicare Other | Admitting: Physical Therapy

## 2014-11-09 DIAGNOSIS — R262 Difficulty in walking, not elsewhere classified: Secondary | ICD-10-CM

## 2014-11-09 DIAGNOSIS — G5702 Lesion of sciatic nerve, left lower limb: Secondary | ICD-10-CM

## 2014-11-09 DIAGNOSIS — M533 Sacrococcygeal disorders, not elsewhere classified: Secondary | ICD-10-CM | POA: Diagnosis not present

## 2014-11-09 DIAGNOSIS — R29898 Other symptoms and signs involving the musculoskeletal system: Secondary | ICD-10-CM

## 2014-11-09 NOTE — Therapy (Signed)
[place], [place], [address] Phone: [phone]   Fax:  [phone]  Physical Therapy Treatment  Patient Details  Name: Cassie Johnson MRN: [id number] Date of Birth: [birth year] Referring Provider:  Phillips Odor, MD  Encounter Date: 11/09/2014      PT End of Session - 11/09/14 1134    Visit Number 5   Number of Visits 10   Date for PT Re-Evaluation 11/23/14   Authorization Type Medicare   Authorization - Visit Number 5   Authorization - Number of Visits 10   PT Start Time 7989   PT Stop Time 1138   PT Time Calculation (min) 41 min   Activity Tolerance Patient tolerated treatment well   Behavior During Therapy [facility] for tasks assessed/performed      Past Medical History  Diagnosis Date  . Reflux   . High blood pressure   . High cholesterol     Past Surgical History  Procedure Laterality Date  . Carpal tunnel release    . Vesicovaginal fistula closure w/ tah    . Cholecystectomy      There were no vitals filed for this visit.  Visit Diagnosis:  Sacroiliac joint pain  Difficulty walking  Piriformis syndrome of left side  Weakness of left leg                       [facility] Adult PT Treatment/Exercise - 11/09/14 1056    Knee/Hip Exercises: Stretches   Active Hamstring Stretch Both;30 seconds;2 reps   Active Hamstring Stretch Limitations 12" step   Hip Flexor Stretch Left;2 reps;30 seconds   Hip Flexor Stretch Limitations 12" step   Gastroc Stretch 3 reps;30 seconds   Gastroc Stretch Limitations slant board   Knee/Hip Exercises: Standing   Heel Raises Both;15 reps   Heel Raises Limitations toeraises 15 reps no UE's   Forward Lunges Both;10 reps   Forward Lunges Limitations 4" step no UE's   Side Lunges Both;10 reps   Side Lunges Limitations 4" step no UE"s   Hip Abduction 15 reps;1 set   Hip Extension 15 reps;Both   SLS airex max Rt:31" Lt:29"   SLS with Vectors 5X5" each  with 1 HHA on airex                  PT Short Term Goals - 10/26/14 1341    PT SHORT TERM GOAL #1   Title Pt will be independent with HEP.    Time 2   Period Weeks   Status New   PT SHORT TERM GOAL #2   Title Improve L hip abduction strength to 4/5 or greater to improve stability of SI joint.    Time 2   Period Weeks   Status New   PT SHORT TERM GOAL #3   Title Improve R hip extension strength to 4-/5 to improve ability to climb stairs and walk up hills without pain.    Time 2   Period Weeks   Status New           PT Long Term Goals - 10/26/14 1343    PT LONG TERM GOAL #1   Title Pt will be independent with advanced HEP for core and SI stabilization.    Time 4   Period Weeks   Status New   PT LONG TERM GOAL #2   Title Improve hip abduction and extension strength to 4+/5 or greater to improve  stability of SI and decrease piriformis pain to allow pt to ambulate without pain.    Time 4   Period Weeks   Status New   PT LONG TERM GOAL #3   Title SI provocation tests (ASIS gapping, FABER, and SLR) will all be negative to demonstrate proper mechanics of SI to allow pt to climb stairs and walk without pain.    Time 4   Period Weeks   Status New   PT LONG TERM GOAL #4   Title Pt will demonstrate equal length of piriformis bilaterally and no pain with palpation of piriformis muscle belly to return pt to PLOF without pain.   Time 4   Period Weeks   Status New               Plan - 11/09/14 1135    Clinical Impression Statement continued with stability and strengthening exercise progression.  advanced to SLS and vectors using airex pad to further challenge stability.  Pt able to sustain balance approx 30" on each LE without UE assist.  Added forward lunges onto 4" riser wtihout UE assist as well.  Pt with good control and form noted.  Added hip flexor stretch as well.  Pt in good alignment this session.    PT Duration --  4-6 weeks   PT Next Visit Plan  Continue with manual to L piriformis as needed.  Check alignment each session and progress stabiliy and strengthening exercises.    PT Home Exercise Plan Given        Problem List Patient Active Problem List   Diagnosis Date Noted  . Spinal stenosis of lumbar region 06/21/2011  . Lumbar herniated disc 06/06/2011  . SCIATICA 08/01/2009  . LUMBAR SPRAIN AND STRAIN 08/01/2009  . HIGH BLOOD PRESSURE 08/01/2009    Teena Irani, PTA/CLT [phone]  11/09/2014, 11:42 AM  [place], [place], [address] Phone: [phone]   Fax:  [phone]

## 2014-11-11 ENCOUNTER — Ambulatory Visit (HOSPITAL_COMMUNITY): Payer: Medicare Other

## 2014-11-11 DIAGNOSIS — G5702 Lesion of sciatic nerve, left lower limb: Secondary | ICD-10-CM

## 2014-11-11 DIAGNOSIS — R262 Difficulty in walking, not elsewhere classified: Secondary | ICD-10-CM

## 2014-11-11 DIAGNOSIS — R29898 Other symptoms and signs involving the musculoskeletal system: Secondary | ICD-10-CM

## 2014-11-11 DIAGNOSIS — M533 Sacrococcygeal disorders, not elsewhere classified: Secondary | ICD-10-CM

## 2014-11-11 NOTE — Patient Instructions (Signed)
ABDUCTION: Standing (Active)   Stand, feet flat. Lift right leg out to side.. Complete 1-2 sets of 10-20 repetitions.   http://gtsc.exer.us/111   Copyright  VHI. All rights reserved.   EXTENSION: Standing (Active)   Stand, both feet flat. Draw right leg behind body as far as possible Complete 1-2 sets of 10-20 repetitions.   http://gtsc.exer.us/77   Copyright  VHI. All rights reserved.

## 2014-11-11 NOTE — Therapy (Signed)
[place] [address] [place], [place], [address] Phone: [phone]   Fax:  [phone]  Physical Therapy Treatment  Patient Details  Name: Cassie Johnson MRN: [id number] Date of Birth: Jan 09, 1946 Referring Provider:  Phillips Odor, MD  Encounter Date: 11/11/2014      PT End of Session - 11/11/14 1039    Visit Number 6   Number of Visits 10   Date for PT Re-Evaluation 11/23/14   Authorization Type Medicare   Authorization - Visit Number 6   Authorization - Number of Visits 10   PT Start Time 1020   PT Stop Time 1108   PT Time Calculation (min) 48 min   Activity Tolerance Patient tolerated treatment well   Behavior During Therapy [facility] for tasks assessed/performed      Past Medical History  Diagnosis Date  . Reflux   . High blood pressure   . High cholesterol     Past Surgical History  Procedure Laterality Date  . Carpal tunnel release    . Vesicovaginal fistula closure w/ tah    . Cholecystectomy      There were no vitals filed for this visit.  Visit Diagnosis:  Sacroiliac joint pain  Difficulty walking  Piriformis syndrome of left side  Weakness of left leg      Subjective Assessment - 11/11/14 1020    Subjective Pt reports she walked for an hour yesterday, has been walking 3x a week.  No reports of pain today, Lt LE feels weak.   Currently in Pain? No/denies                         [facility] Adult PT Treatment/Exercise - 11/11/14 0001    Exercises   Exercises Knee/Hip   Knee/Hip Exercises: Stretches   Piriformis Stretch Both;30 seconds;2 reps   Piriformis Stretch Limitations seated   Knee/Hip Exercises: Aerobic   Nustep 10 minutes level 3 hills #3 LE only   Knee/Hip Exercises: Standing   Heel Raises Both;10 reps   Heel Raises Limitations proper lifting yellow ball from 12in then heel raise   Forward Lunges Both;15 reps   Forward Lunges Limitations 4" step with UE flexion 2#   Side  Lunges Both;10 reps   Side Lunges Limitations 4" step no UE"s   Hip Abduction 15 reps;1 set   Abduction Limitations HEP given   Hip Extension 15 reps;Both   Extension Limitations HEP given   SLS airex Rt 29", Lt 36" max of 3   SLS with Vectors 5X5" each with 1 HHA on airex   Manual Therapy   Muscle Energy Technique SI within alignment, no MET required                  PT Short Term Goals - 10/26/14 1341    PT SHORT TERM GOAL #1   Title Pt will be independent with HEP.    Time 2   Period Weeks   Status New   PT SHORT TERM GOAL #2   Title Improve L hip abduction strength to 4/5 or greater to improve stability of SI joint.    Time 2   Period Weeks   Status New   PT SHORT TERM GOAL #3   Title Improve R hip extension strength to 4-/5 to improve ability to climb stairs and walk up hills without pain.    Time 2   Period Weeks   Status New  PT Long Term Goals - 10/26/14 1343    PT LONG TERM GOAL #1   Title Pt will be independent with advanced HEP for core and SI stabilization.    Time 4   Period Weeks   Status New   PT LONG TERM GOAL #2   Title Improve hip abduction and extension strength to 4+/5 or greater to improve stability of SI and decrease piriformis pain to allow pt to ambulate without pain.    Time 4   Period Weeks   Status New   PT LONG TERM GOAL #3   Title SI provocation tests (ASIS gapping, FABER, and SLR) will all be negative to demonstrate proper mechanics of SI to allow pt to climb stairs and walk without pain.    Time 4   Period Weeks   Status New   PT LONG TERM GOAL #4   Title Pt will demonstrate equal length of piriformis bilaterally and no pain with palpation of piriformis muscle belly to return pt to PLOF without pain.   Time 4   Period Weeks   Status New               Plan - 11/11/14 1205    Clinical Impression Statement Checked SI alignment, no muscle energy technique required.  Session focus on improving core  strengthening and progressed stabiltiy with exercises.  Added UE flexion with forward lunges for increased core activatino.  Pt given HEP for standing hip abduction and extension for hip musculature strengthening.  No reports of pain through session, pt was limited by fatigue with activities.   PT Next Visit Plan Continue with manual to L piriformis as needed.  Check alignment each session and progress stabiliy and strengthening exercises.   Begin sidestepping and warrior poses for stabilty.        Problem List Patient Active Problem List   Diagnosis Date Noted  . Spinal stenosis of lumbar region 06/21/2011  . Lumbar herniated disc 06/06/2011  . SCIATICA 08/01/2009  . LUMBAR SPRAIN AND STRAIN 08/01/2009  . HIGH BLOOD PRESSURE 08/01/2009   Ihor Austin, LPTA; CBIS [phone]  Aldona Lento 11/11/2014, 12:11 PM  [place], [place], [address] Phone: [phone]   Fax:  [phone]

## 2014-11-16 ENCOUNTER — Ambulatory Visit (HOSPITAL_COMMUNITY): Payer: Medicare Other | Admitting: Physical Therapy

## 2014-11-16 DIAGNOSIS — M533 Sacrococcygeal disorders, not elsewhere classified: Secondary | ICD-10-CM | POA: Diagnosis not present

## 2014-11-16 DIAGNOSIS — R262 Difficulty in walking, not elsewhere classified: Secondary | ICD-10-CM

## 2014-11-16 DIAGNOSIS — G5702 Lesion of sciatic nerve, left lower limb: Secondary | ICD-10-CM

## 2014-11-16 DIAGNOSIS — R29898 Other symptoms and signs involving the musculoskeletal system: Secondary | ICD-10-CM

## 2014-11-16 NOTE — Therapy (Signed)
[place], [place], [address] Phone: [phone]   Fax:  [phone]  Physical Therapy Treatment  Patient Details  Name: Cassie Johnson MRN: [id number] Date of Birth: 04-22-1945 Referring Provider:  Phillips Odor, MD  Encounter Date: 11/16/2014      PT End of Session - 11/16/14 0927    Visit Number 7   Number of Visits 10   Date for PT Re-Evaluation 11/23/14   Authorization Type Medicare   Authorization - Visit Number 7   Authorization - Number of Visits 10   PT Start Time 0850   PT Stop Time 0934   PT Time Calculation (min) 44 min   Activity Tolerance Patient tolerated treatment well   Behavior During Therapy [facility] for tasks assessed/performed      Past Medical History  Diagnosis Date  . Reflux   . High blood pressure   . High cholesterol     Past Surgical History  Procedure Laterality Date  . Carpal tunnel release    . Vesicovaginal fistula closure w/ tah    . Cholecystectomy      There were no vitals filed for this visit.  Visit Diagnosis:  Sacroiliac joint pain  Difficulty walking  Piriformis syndrome of left side  Weakness of left leg      Subjective Assessment - 11/16/14 0854    Subjective Pt reports that she has been feeling pretty good lately. She was working in her yard yesterday, doing a lot of bending, and she got some numbness in her L leg.    Currently in Pain? No/denies   Pain Score 0-No pain                         [facility] Adult PT Treatment/Exercise - 11/16/14 0001    Exercises   Exercises Knee/Hip;Lumbar   Lumbar Exercises: Quadruped   Straight Leg Raise 10 reps   Other Quadruped Lumbar Exercises fire hydrant x 10 bilaterally   Knee/Hip Exercises: Stretches   Active Hamstring Stretch Both;30 seconds;2 reps   Active Hamstring Stretch Limitations 12" step   Piriformis Stretch Both;30 seconds;2 reps   Piriformis Stretch Limitations seated   Knee/Hip  Exercises: Aerobic   Nustep 10 minutes level 3 hills #3 LE only   Knee/Hip Exercises: Standing   Forward Lunges Both;10 reps   Forward Lunges Limitations into // bars   Side Lunges Both;10 reps   Side Lunges Limitations into // bars   SLS with Vectors 5X5" each with 1 HHA on airex   Other Standing Knee Exercises sidestepping with RTB x 2 RT   Knee/Hip Exercises: Supine   Single Leg Bridge Both;10 reps   Knee/Hip Exercises: Sidelying   Hip ABduction 15 reps                  PT Short Term Goals - 10/26/14 1341    PT SHORT TERM GOAL #1   Title Pt will be independent with HEP.    Time 2   Period Weeks   Status New   PT SHORT TERM GOAL #2   Title Improve L hip abduction strength to 4/5 or greater to improve stability of SI joint.    Time 2   Period Weeks   Status New   PT SHORT TERM GOAL #3   Title Improve R hip extension strength to 4-/5 to improve ability to climb stairs and walk up hills without pain.  Time 2   Period Weeks   Status New           PT Long Term Goals - 10/26/14 1343    PT LONG TERM GOAL #1   Title Pt will be independent with advanced HEP for core and SI stabilization.    Time 4   Period Weeks   Status New   PT LONG TERM GOAL #2   Title Improve hip abduction and extension strength to 4+/5 or greater to improve stability of SI and decrease piriformis pain to allow pt to ambulate without pain.    Time 4   Period Weeks   Status New   PT LONG TERM GOAL #3   Title SI provocation tests (ASIS gapping, FABER, and SLR) will all be negative to demonstrate proper mechanics of SI to allow pt to climb stairs and walk without pain.    Time 4   Period Weeks   Status New   PT LONG TERM GOAL #4   Title Pt will demonstrate equal length of piriformis bilaterally and no pain with palpation of piriformis muscle belly to return pt to PLOF without pain.   Time 4   Period Weeks   Status New               Plan - 11/16/14 2774    Clinical Impression  Statement Treatment session focused on improving hip strength, with addition of fire hydrants, quadruped hip extension, and sidestepping with theraband resistance. Pt required verbal and tactile cueing during new exercises in order to maintain proper form, but she denied any increased pain with therex today.    PT Next Visit Plan Continue with manual to L piriformis PRN, continue with hip strengthening        Problem List Patient Active Problem List   Diagnosis Date Noted  . Spinal stenosis of lumbar region 06/21/2011  . Lumbar herniated disc 06/06/2011  . SCIATICA 08/01/2009  . LUMBAR SPRAIN AND STRAIN 08/01/2009  . HIGH BLOOD PRESSURE 08/01/2009    Hilma Favors, PT, DPT [phone] 11/16/2014, 9:31 AM  [place] [address] [place], [place], [address] Phone: [phone]   Fax:  [phone]

## 2014-11-18 ENCOUNTER — Ambulatory Visit (HOSPITAL_COMMUNITY): Payer: Medicare Other | Admitting: Physical Therapy

## 2014-11-18 DIAGNOSIS — R262 Difficulty in walking, not elsewhere classified: Secondary | ICD-10-CM

## 2014-11-18 DIAGNOSIS — M533 Sacrococcygeal disorders, not elsewhere classified: Secondary | ICD-10-CM | POA: Diagnosis not present

## 2014-11-18 DIAGNOSIS — G5702 Lesion of sciatic nerve, left lower limb: Secondary | ICD-10-CM

## 2014-11-18 DIAGNOSIS — R29898 Other symptoms and signs involving the musculoskeletal system: Secondary | ICD-10-CM

## 2014-11-18 NOTE — Therapy (Signed)
[place], [place], [address] Phone: [phone]   Fax:  [phone]  Physical Therapy Treatment  Patient Details  Name: Cassie Johnson MRN: [id number] Date of Birth: 1945-11-05 Referring Provider:  Chip Boer, PA-C  Encounter Date: 11/18/2014      PT End of Session - 11/18/14 1129    Visit Number 8   Number of Visits 10   Date for PT Re-Evaluation 11/23/14   Authorization Type Medicare   Authorization - Visit Number 8   Authorization - Number of Visits 10   PT Start Time 6063   PT Stop Time 1058   PT Time Calculation (min) 40 min   Activity Tolerance Patient tolerated treatment well   Behavior During Therapy [facility] for tasks assessed/performed      Past Medical History  Diagnosis Date  . Reflux   . High blood pressure   . High cholesterol     Past Surgical History  Procedure Laterality Date  . Carpal tunnel release    . Vesicovaginal fistula closure w/ tah    . Cholecystectomy      There were no vitals filed for this visit.  Visit Diagnosis:  Sacroiliac joint pain  Difficulty walking  Piriformis syndrome of left side  Weakness of left leg      Subjective Assessment - 11/18/14 1133    Subjective Pt states she is completing her HEP and is only having increased pain when she overdoes things.  Pt states completing yard work increased her symptoms/numbness in her LLE.  currently without pain.   Currently in Pain? No/denies                         [facility] Adult PT Treatment/Exercise - 11/18/14 0001    Lumbar Exercises: Quadruped   Straight Leg Raise 15 reps   Other Quadruped Lumbar Exercises fire hydrant x 15 bilaterally   Knee/Hip Exercises: Stretches   Active Hamstring Stretch Both;30 seconds;2 reps   Active Hamstring Stretch Limitations 12" step   Gastroc Stretch 3 reps;30 seconds   Gastroc Stretch Limitations slant board   Knee/Hip Exercises: Aerobic   Nustep 10  minutes level 3 hills #3 LE only   Knee/Hip Exercises: Standing   Heel Raises Both;10 reps   Heel Raises Limitations proper lifting yellow ball from 12in then heel raise   Forward Lunges Both;15 reps   Forward Lunges Limitations no riser, no UE's into // bars   Side Lunges Both;15 reps   Side Lunges Limitations no riser, no UE's into // bars   Functional Squat 10 reps   SLS with Vectors 5X5" each with 1 HHA on airex   Other Standing Knee Exercises sidestepping with GTB x 2 RT   Knee/Hip Exercises: Supine   Single Leg Bridge Both;10 reps   Manual Therapy   Muscle Energy Technique SI within alignment, no MET required                  PT Short Term Goals - 10/26/14 1341    PT SHORT TERM GOAL #1   Title Pt will be independent with HEP.    Time 2   Period Weeks   Status New   PT SHORT TERM GOAL #2   Title Improve L hip abduction strength to 4/5 or greater to improve stability of SI joint.    Time 2   Period Weeks   Status New   PT SHORT  TERM GOAL #3   Title Improve R hip extension strength to 4-/5 to improve ability to climb stairs and walk up hills without pain.    Time 2   Period Weeks   Status New           PT Long Term Goals - 10/26/14 1343    PT LONG TERM GOAL #1   Title Pt will be independent with advanced HEP for core and SI stabilization.    Time 4   Period Weeks   Status New   PT LONG TERM GOAL #2   Title Improve hip abduction and extension strength to 4+/5 or greater to improve stability of SI and decrease piriformis pain to allow pt to ambulate without pain.    Time 4   Period Weeks   Status New   PT LONG TERM GOAL #3   Title SI provocation tests (ASIS gapping, FABER, and SLR) will all be negative to demonstrate proper mechanics of SI to allow pt to climb stairs and walk without pain.    Time 4   Period Weeks   Status New   PT LONG TERM GOAL #4   Title Pt will demonstrate equal length of piriformis bilaterally and no pain with palpation of  piriformis muscle belly to return pt to PLOF without pain.   Time 4   Period Weeks   Status New               Plan - 11/18/14 1130    Clinical Impression Statement Progressed to functional squats with good depth and form achieved.  Quadruped hip extension difficulty to stabilize hips and prevent rocking with actvitiy.  Pt requires cues to isolate correct musculature and keep spine stable due to weak glutes.  Improved activity tolerance noted with nustep and able to average 111 SPM today.  Pt is progressing well.    PT Next Visit Plan Continue with manual to L piriformis PRN, continue with hip strengthening.        Problem List Patient Active Problem List   Diagnosis Date Noted  . Spinal stenosis of lumbar region 06/21/2011  . Lumbar herniated disc 06/06/2011  . SCIATICA 08/01/2009  . LUMBAR SPRAIN AND STRAIN 08/01/2009  . HIGH BLOOD PRESSURE 08/01/2009    Teena Irani, PTA/CLT [phone]  11/18/2014, 11:36 AM  [place] [address] [place], [place], [address] Phone: [phone]   Fax:  [phone]

## 2014-11-23 ENCOUNTER — Ambulatory Visit (HOSPITAL_COMMUNITY): Payer: Medicare Other | Admitting: Physical Therapy

## 2014-11-23 DIAGNOSIS — R262 Difficulty in walking, not elsewhere classified: Secondary | ICD-10-CM

## 2014-11-23 DIAGNOSIS — M533 Sacrococcygeal disorders, not elsewhere classified: Secondary | ICD-10-CM

## 2014-11-23 DIAGNOSIS — G5702 Lesion of sciatic nerve, left lower limb: Secondary | ICD-10-CM

## 2014-11-23 DIAGNOSIS — R29898 Other symptoms and signs involving the musculoskeletal system: Secondary | ICD-10-CM

## 2014-11-23 NOTE — Therapy (Signed)
[facility] [address] [place], [place], [address] Phone: [phone]   Fax:  [phone]  Physical Therapy Treatment  Patient Details  Name: Cassie Johnson MRN: [id number] Date of Birth: [birth year] Referring Provider:  Phillips Odor, MD  Encounter Date: 11/23/2014      PT End of Session - 11/23/14 1154    Visit Number 9   Number of Visits 9   Authorization Type Medicare   Authorization - Visit Number 9   PT Start Time 0930   PT Stop Time 1010   PT Time Calculation (min) 40 min   Activity Tolerance Patient tolerated treatment well   Behavior During Therapy [facility] for tasks assessed/performed      Past Medical History  Diagnosis Date  . Reflux   . High blood pressure   . High cholesterol     Past Surgical History  Procedure Laterality Date  . Carpal tunnel release    . Vesicovaginal fistula closure w/ tah    . Cholecystectomy      There were no vitals filed for this visit.  Visit Diagnosis:  Sacroiliac joint pain  Difficulty walking  Piriformis syndrome of left side  Weakness of left leg      Subjective Assessment - 11/23/14 0937    Subjective Pt reports that she has not been experiencing as much pain lately. She walked 2 miles yesterday, and she reports that she had some fatigue in her back, but no pain. Pt reports that she now has less pain with stretching, is able to walk longer distances, and has had decreased pain since beginning PT. She reports that she still feels that her activity tolerance is limited.   How long can you sit comfortably? one hour   How long can you stand comfortably? no limitations   How long can you walk comfortably? one hour   Currently in Pain? No/denies   Pain Score 0-No pain            [facility] PT Assessment - 11/23/14 0001    Observation/Other Assessments   Focus on Therapeutic Outcomes (FOTO)  37% limited   Strength   Right Hip Flexion 4+/5   Right Hip Extension 4/5   Right  Hip ABduction 4+/5   Left Hip Flexion 4+/5   Left Hip Extension 4-/5   Left Hip ABduction 4+/5   Right Knee Flexion 4+/5   Right Knee Extension 4+/5   Left Knee Flexion 4+/5   Left Knee Extension 4+/5   Straight Leg Raise   Comment 60 degrees   Pelvic Dictraction   Findings Negative   Pelvic Compression   Findings Negative   Saralyn Pilar (FABER) Test   Findings Negative                     [facility] Adult PT Treatment/Exercise - 11/23/14 0001    Lumbar Exercises: Quadruped   Straight Leg Raise 15 reps   Other Quadruped Lumbar Exercises fire hydrant x 15 bilaterally   Knee/Hip Exercises: Stretches   Active Hamstring Stretch Both;30 seconds;2 reps   Active Hamstring Stretch Limitations 12" step   Piriformis Stretch Both;30 seconds;2 reps   Piriformis Stretch Limitations seated   Gastroc Stretch 3 reps;30 seconds   Gastroc Stretch Limitations slant board   Knee/Hip Exercises: Standing   Other Standing Knee Exercises sidestepping with GTB x 2 RT   Knee/Hip Exercises: Supine   Single Leg Bridge Both;10 reps  PT Education - 06-Dec-2014 1154    Education provided Yes   Education Details Goals and progress reviewed, HEP updated          PT Short Term Goals - December 06, 2014 1208    PT SHORT TERM GOAL #1   Title Pt will be independent with HEP.    Time 2   Period Weeks   Status Achieved   PT SHORT TERM GOAL #2   Title Improve L hip abduction strength to 4/5 or greater to improve stability of SI joint.    Time 2   Period Weeks   Status Achieved   PT SHORT TERM GOAL #3   Title Improve R hip extension strength to 4-/5 to improve ability to climb stairs and walk up hills without pain.    Time 2   Period Weeks   Status Achieved           PT Long Term Goals - 06-Dec-2014 1208    PT LONG TERM GOAL #1   Title Pt will be independent with advanced HEP for core and SI stabilization.    Time 4   Period Weeks   Status Achieved   PT LONG TERM GOAL #2    Title Improve hip abduction and extension strength to 4+/5 or greater to improve stability of SI and decrease piriformis pain to allow pt to ambulate without pain.    Time 4   Period Weeks   Status Partially Met   PT LONG TERM GOAL #3   Title SI provocation tests (ASIS gapping, FABER, and SLR) will all be negative to demonstrate proper mechanics of SI to allow pt to climb stairs and walk without pain.    Time 4   Period Weeks   Status Achieved   PT LONG TERM GOAL #4   Title Pt will demonstrate equal length of piriformis bilaterally and no pain with palpation of piriformis muscle belly to return pt to PLOF without pain.   Time 4   Period Weeks   Status Achieved               Plan - 12-06-2014 1155    Clinical Impression Statement Reassessment was completed today. Pt demonstrates improvements in strength, functional activity tolerance, gait, and pain levels. She has met her LTGs at this time, and has demonstrated excellent compliance with her HEP. She is being discharged to continue independently with her HEP.    PT Treatment/Interventions ADLs/Self Care Home Management;Moist Heat;Gait training;Stair training;Functional mobility training;Therapeutic activities;Therapeutic exercise;Neuromuscular re-education;Patient/family education;Manual techniques          G-Codes - 12-06-2014 1210    Functional Assessment Tool Used FOTO   Functional Limitation Changing and maintaining body position   Changing and Maintaining Body Position Goal Status (Z7673) At least 20 percent but less than 40 percent impaired, limited or restricted   Changing and Maintaining Body Position Discharge Status (A1937) At least 20 percent but less than 40 percent impaired, limited or restricted      Problem List Patient Active Problem List   Diagnosis Date Noted  . Spinal stenosis of lumbar region 06/21/2011  . Lumbar herniated disc 06/06/2011  . SCIATICA 08/01/2009  . LUMBAR SPRAIN AND STRAIN 08/01/2009  .  HIGH BLOOD PRESSURE 08/01/2009    PHYSICAL THERAPY DISCHARGE SUMMARY  Visits from Start of Care: 9  Current functional level related to goals / functional outcomes: Pt demonstrates increased strength, improved length of piriformis, and improved functional activity tolerance.    Remaining deficits: Pt  demonstrates minor weakness of bilateral hip extensors.    Education / Equipment: HEP  Plan: Patient agrees to discharge.  Patient goals were met. Patient is being discharged due to meeting the stated rehab goals.  ?????        Hilma Favors, PT, DPT [phone] 11/23/2014, 12:12 PM  [facility] [place], [place], [address] Phone: [phone]   Fax:  [phone]

## 2014-11-23 NOTE — Therapy (Signed)
[facility] [address] [place], [place], [address] Phone: [phone]   Fax:  [phone]  Physical Therapy Treatment  Patient Details  Name: Cassie Johnson MRN: [id number] Date of Birth: 04/02/1945 Referring Provider:  Phillips Odor, MD  Encounter Date: 11/23/2014      PT End of Session - 11/23/14 1154    Visit Number 9   Number of Visits 9   Authorization Type Medicare   Authorization - Visit Number 9   PT Start Time 0930   PT Stop Time 1010   PT Time Calculation (min) 40 min   Activity Tolerance Patient tolerated treatment well   Behavior During Therapy [facility] for tasks assessed/performed      Past Medical History  Diagnosis Date  . Reflux   . High blood pressure   . High cholesterol     Past Surgical History  Procedure Laterality Date  . Carpal tunnel release    . Vesicovaginal fistula closure w/ tah    . Cholecystectomy      There were no vitals filed for this visit.  Visit Diagnosis:  Sacroiliac joint pain  Difficulty walking  Piriformis syndrome of left side  Weakness of left leg      Subjective Assessment - 11/23/14 0937    Subjective Pt reports that she has not been experiencing as much pain lately. She walked 2 miles yesterday, and she reports that she had some fatigue in her back, but no pain. Pt reports that she now has less pain with stretching, is able to walk longer distances, and has had decreased pain since beginning PT. She reports that she still feels that her activity tolerance is limited.   How long can you sit comfortably? one hour   How long can you stand comfortably? no limitations   How long can you walk comfortably? one hour   Currently in Pain? No/denies   Pain Score 0-No pain            [facility] PT Assessment - 11/23/14 0001    Observation/Other Assessments   Focus on Therapeutic Outcomes (FOTO)  37% limited   Strength   Right Hip Flexion 4+/5   Right Hip Extension 4/5   Right  Hip ABduction 4+/5   Left Hip Flexion 4+/5   Left Hip Extension 4-/5   Left Hip ABduction 4+/5   Right Knee Flexion 4+/5   Right Knee Extension 4+/5   Left Knee Flexion 4+/5   Left Knee Extension 4+/5   Straight Leg Raise   Comment 60 degrees   Pelvic Dictraction   Findings Negative   Pelvic Compression   Findings Negative   Saralyn Pilar (FABER) Test   Findings Negative               [facility] Adult PT Treatment/Exercise - 11/23/14 0001    Lumbar Exercises: Quadruped   Straight Leg Raise 15 reps   Other Quadruped Lumbar Exercises fire hydrant x 15 bilaterally   Knee/Hip Exercises: Stretches   Active Hamstring Stretch Both;30 seconds;2 reps   Active Hamstring Stretch Limitations 12" step   Piriformis Stretch Both;30 seconds;2 reps   Piriformis Stretch Limitations seated   Gastroc Stretch 3 reps;30 seconds   Gastroc Stretch Limitations slant board   Knee/Hip Exercises: Standing   Other Standing Knee Exercises sidestepping with GTB x 2 RT   Knee/Hip Exercises: Supine   Single Leg Bridge Both;10 reps  PT Education - 2014/12/19 1154    Education provided Yes   Education Details Goals and progress reviewed, HEP updated          PT Short Term Goals - 12/19/14 1208    PT SHORT TERM GOAL #1   Title Pt will be independent with HEP.    Time 2   Period Weeks   Status Achieved   PT SHORT TERM GOAL #2   Title Improve L hip abduction strength to 4/5 or greater to improve stability of SI joint.    Time 2   Period Weeks   Status Achieved   PT SHORT TERM GOAL #3   Title Improve R hip extension strength to 4-/5 to improve ability to climb stairs and walk up hills without pain.    Time 2   Period Weeks   Status Achieved           PT Long Term Goals - 19-Dec-2014 1208    PT LONG TERM GOAL #1   Title Pt will be independent with advanced HEP for core and SI stabilization.    Time 4   Period Weeks   Status Achieved   PT LONG TERM GOAL #2   Title Improve  hip abduction and extension strength to 4+/5 or greater to improve stability of SI and decrease piriformis pain to allow pt to ambulate without pain.    Time 4   Period Weeks   Status Partially Met   PT LONG TERM GOAL #3   Title SI provocation tests (ASIS gapping, FABER, and SLR) will all be negative to demonstrate proper mechanics of SI to allow pt to climb stairs and walk without pain.    Time 4   Period Weeks   Status Achieved   PT LONG TERM GOAL #4   Title Pt will demonstrate equal length of piriformis bilaterally and no pain with palpation of piriformis muscle belly to return pt to PLOF without pain.   Time 4   Period Weeks   Status Achieved               Plan - Dec 19, 2014 1155    Clinical Impression Statement Reassessment was completed today. Pt demonstrates improvements in strength, functional activity tolerance, gait, and pain levels. She has met her LTGs at this time, and has demonstrated excellent compliance with her HEP. She is being discharged to continue independently with her HEP.    PT Treatment/Interventions ADLs/Self Care Home Management;Moist Heat;Gait training;Stair training;Functional mobility training;Therapeutic activities;Therapeutic exercise;Neuromuscular re-education;Patient/family education;Manual techniques          G-Codes - 19-Dec-2014 1210    Functional Assessment Tool Used FOTO   Functional Limitation Changing and maintaining body position   Changing and Maintaining Body Position Goal Status (G6269) At least 20 percent but less than 40 percent impaired, limited or restricted   Changing and Maintaining Body Position Discharge Status (S8546) At least 20 percent but less than 40 percent impaired, limited or restricted      Problem List Patient Active Problem List   Diagnosis Date Noted  . Spinal stenosis of lumbar region 06/21/2011  . Lumbar herniated disc 06/06/2011  . SCIATICA 08/01/2009  . LUMBAR SPRAIN AND STRAIN 08/01/2009  . HIGH BLOOD  PRESSURE 08/01/2009    Hilma Favors, PT, DPT [phone] Dec 19, 2014, 12:11 PM  [facility] [address] [place], [place], [address] Phone: [phone]   Fax:  [phone]

## 2014-11-25 ENCOUNTER — Ambulatory Visit (HOSPITAL_COMMUNITY): Payer: Medicare Other | Admitting: Physical Therapy

## 2018-07-23 ENCOUNTER — Ambulatory Visit (INDEPENDENT_AMBULATORY_CARE_PROVIDER_SITE_OTHER): Payer: Medicare Other | Admitting: Allergy & Immunology

## 2018-07-23 ENCOUNTER — Encounter: Payer: Self-pay | Admitting: Allergy & Immunology

## 2018-07-23 ENCOUNTER — Other Ambulatory Visit: Payer: Self-pay

## 2018-07-23 VITALS — BP 172/84 | HR 78 | Temp 97.6°F | Resp 14 | Ht 62.5 in | Wt 156.4 lb

## 2018-07-23 DIAGNOSIS — J302 Other seasonal allergic rhinitis: Secondary | ICD-10-CM | POA: Insufficient documentation

## 2018-07-23 DIAGNOSIS — L500 Allergic urticaria: Secondary | ICD-10-CM | POA: Insufficient documentation

## 2018-07-23 DIAGNOSIS — L299 Pruritus, unspecified: Secondary | ICD-10-CM

## 2018-07-23 DIAGNOSIS — J3089 Other allergic rhinitis: Secondary | ICD-10-CM | POA: Diagnosis not present

## 2018-07-23 NOTE — Progress Notes (Signed)
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 Itching - Your history does not have any "red flags" such as fevers, joint pains, or permanent skin changes that would be concerning for a more serious cause of hives.  - We will get some labs to rule out serious causes of hives: complete blood count, tryptase level, alpha gal panel, CMP, ESR, and CRP. - Testing revealed multiple sensitizations including grasses, weeds, trees, indoor and outdoor molds, and tobacco leaf (avoidance measures provided).  - Chronic hives are often times a self limited process and will "burn themselves out" over 6-12 months, although this is not always the case.  - In the meantime, start suppressive dosing of antihistamines:   - Morning: Zyrtec (cetirizine) 10-62m (one to two tablets)  - Evening: Zyrtec (cetirizine) 10-264m(one to two tablets) - You can change this dosing at home, decreasing the dose as needed or increasing the dosing as needed.   2. Perennial and seasonal allergic rhinitis - Testing today showed: tobacco, grasses, weeds, trees, indoor molds and outdoor molds - Copy of test results provided.  - Avoidance measures provided - Continue with: Flonase (fluticasone) one spray per nostril daily - You can use an extra dose of the antihistamine, if needed, for breakthrough symptoms.  - Consider nasal saline rinses 1-2 times daily to remove allergens from the nasal cavities as well as help with mucous clearance (this is especially helpful to do before the nasal sprays are given) - Consider allergy shots as a means of long-term control.  3. Return in about 2 months (around  09/22/2018). This can be an in-person, a virtual Webex or a telephone follow up visit.   Subjective:   Cassie Johnson a [age].o. female presenting today for evaluation of  Chief Complaint  Patient presents with  . Pruritis    Cassie Johnson a history of the following: Patient Active Problem List   Diagnosis Date Noted  . Spinal stenosis of lumbar region 06/21/2011  . Lumbar herniated disc 06/06/2011  . SCIATICA 08/01/2009  . LUMBAR SPRAIN AND STRAIN 08/01/2009  . HIGH BLOOD PRESSURE 08/01/2009    History obtained from: chart review and patient.  Cassie Johnson referred by Claggett, Elin, PA-C.     [name] a [age].o. female presenting for an evaluation of itching.  She reports that she has had itching for 2 weeks.  She is unsure what she was doing when it started, but it started on her head and then has transferred over to include the entirety of her body.  She has had no systemic reactions with this including throat swelling, abdominal pain, wheezing, or other concerns.  Occasionally she will have some small bumps that become whelps.  She is unsure of the trigger.  She has no animals or self, but she did see a friend who had cats around 2 weeks ago before the itching started.  She has had no new foods at all.  She tolerates all of the major food allergens without adverse event.  She does bring in a list of foods that she has had over the last week.  There is nothing in this list that  she did not previously tolerate.  She also uses a hair dye, but is used the same brand for years.  She uses [employer] as well as [employer] detergent.    She does use Eucerin and Goldbond for her skin, which do not provide much relief.  She has tried using Benadryl which helps.  50 mg in particular knocks her out and does well to control her itching, as she is sleeping the whole time.  She does have a history of rhinitis.  She has been on Flonase for around 3 years.   She typically just uses this when she is mowing to prevent any allergy symptoms.  Otherwise, she has no history of environmental allergy symptoms.  She has had no new medication changes whatsoever.  She has no history of asthma.  She has no history of eczema. Otherwise, there is no history of other atopic diseases, including drug allergies, stinging insect allergies or contact dermatitis. There is no significant infectious history. Vaccinations are up to date.    Past Medical History: Patient Active Problem List   Diagnosis Date Noted  . Spinal stenosis of lumbar region 06/21/2011  . Lumbar herniated disc 06/06/2011  . SCIATICA 08/01/2009  . LUMBAR SPRAIN AND STRAIN 08/01/2009  . HIGH BLOOD PRESSURE 08/01/2009    Medication List:  Allergies as of 07/23/2018      Reactions   Prednisone    Sulfa Drugs Cross Reactors       Medication List       Accurate as of Jul 23, 2018  4:42 PM. If you have any questions, ask your nurse or doctor.        STOP taking these medications   gabapentin 100 MG capsule Commonly known as:  NEURONTIN Stopped by:  Valentina Shaggy, MD   MULTIPLE VITAMINS/WOMENS PO Stopped by:  Valentina Shaggy, MD   nabumetone 500 MG tablet Commonly known as:  RELAFEN Stopped by:  Valentina Shaggy, MD   simvastatin 40 MG tablet Commonly known as:  ZOCOR Stopped by:  Valentina Shaggy, MD     TAKE these medications   FLUTICASONE PROPIONATE NA Place into the nose.   latanoprost 0.005 % ophthalmic solution Commonly known as:  XALATAN   lisinopril 5 MG tablet Commonly known as:  ZESTRIL Take 5 mg by mouth daily.   naproxen 500 MG tablet Commonly known as:  NAPROSYN naproxen 500 mg tablet  Take 1 tablet twice a day by oral route for 15 days.   VITAMIN B COMPLEX PO Take by mouth.       Birth History: non-contributory  Developmental History: non-contributory  Past Surgical History: Past Surgical History:  Procedure Laterality Date   . CARPAL TUNNEL RELEASE    . CHOLECYSTECTOMY    . VESICOVAGINAL FISTULA CLOSURE W/ TAH       Family History: Family History  Problem Relation Age of Onset  . Allergic rhinitis Sister   . Asthma Neg Hx   . Eczema Neg Hx   . Urticaria Neg Hx      Social History: [name] lives at home by herself.  She lives in a house that is 71 years old.  There is wood throughout the home.  She has electric heating and central cooling.  There are no animals that are outside of the home.  She does have dust mite covers on the bedding.  There is no tobacco exposure.  She is retired.  Her husband was  in the [employer] for 4 years.   Review of Systems  Constitutional: Negative.  Negative for fever, malaise/fatigue and weight loss.  HENT: Negative.  Negative for congestion, ear discharge and ear pain.   Eyes: Negative for pain, discharge and redness.  Respiratory: Negative for cough, sputum production, shortness of breath and wheezing.   Cardiovascular: Negative.  Negative for chest pain and palpitations.  Gastrointestinal: Negative for abdominal pain and heartburn.  Skin: Negative.  Negative for itching and rash.  Neurological: Negative for dizziness and headaches.  Endo/Heme/Allergies: Negative for environmental allergies. Does not bruise/bleed easily.       Objective:   Blood pressure (!) 172/84, pulse 78, temperature 97.6 F (36.4 C), temperature source Tympanic, resp. rate 14, height 5' 2.5" (1.588 m), weight 156 lb 6.4 oz (70.9 kg), SpO2 97 %. Body mass index is 28.15 kg/m.   Physical Exam:   Physical Exam  Constitutional: She appears well-developed.  Pleasant female.  Very talkative.  HENT:  Head: Normocephalic and atraumatic.  Right Ear: Tympanic membrane, external ear and ear canal normal. No drainage, swelling or tenderness. Tympanic membrane is not injected, not scarred, not erythematous, not retracted and not bulging.  Left Ear: Tympanic membrane, external ear and ear canal normal.  No drainage, swelling or tenderness. Tympanic membrane is not injected, not scarred, not erythematous, not retracted and not bulging.  Nose: Mucosal edema present. No rhinorrhea, nasal deformity or septal deviation. No epistaxis. Right sinus exhibits no maxillary sinus tenderness and no frontal sinus tenderness. Left sinus exhibits no maxillary sinus tenderness and no frontal sinus tenderness.  Mouth/Throat: Uvula is midline and oropharynx is clear and moist. Mucous membranes are not pale and not dry.  Cobblestoning present in the posterior oropharynx.  Eyes: Pupils are equal, round, and reactive to light. Conjunctivae and EOM are normal. Right eye exhibits no chemosis and no discharge. Left eye exhibits no chemosis and no discharge. Right conjunctiva is not injected. Left conjunctiva is not injected.  Allergic shiners bilaterally.  Cardiovascular: Normal rate, regular rhythm and normal heart sounds.  Respiratory: Effort normal and breath sounds normal. No accessory muscle usage. No tachypnea. No respiratory distress. She has no wheezes. She has no rhonchi. She has no rales. She exhibits no tenderness.  Moving air well in all lung fields.  GI: There is no abdominal tenderness. There is no rebound and no guarding.  Lymphadenopathy:       Head (right side): No submandibular, no tonsillar and no occipital adenopathy present.       Head (left side): No submandibular, no tonsillar and no occipital adenopathy present.    She has no cervical adenopathy.  Neurological: She is alert.  Skin: No abrasion, no petechiae and no rash noted. Rash is not papular, not vesicular and not urticarial. No erythema. No pallor.  There are some excoriations present.  No urticaria or papular lesions noted.  Psychiatric: She has a normal mood and affect.     Diagnostic studies:    Allergy Studies:    Airborne Adult Perc - 07/23/18 1412    Time Antigen Placed  1412    Allergen Manufacturer  Lavella Hammock    Location  Back     Number of Test  59    Panel 1  Select    1. Control-Buffer 50% Glycerol  Negative    2. Control-Histamine 1 mg/ml  2+    3. Albumin saline  Negative    4. Worthing  Negative    5. [place]  Negative    6. Johnson  Negative    7. [place] Blue  Negative    8. Meadow Fescue  Negative    9. Perennial Rye  Negative    10. Sweet Vernal  Negative    11. Timothy  2+    12. Cocklebur  2+    13. Burweed Marshelder  2+    14. Ragweed, short  Negative    15. Ragweed, Giant  Negative    16. Plantain,  English  Negative    17. Lamb's Quarters  Negative    18. Sheep Sorrell  Negative    19. Rough Pigweed  Negative    20. Marsh Elder, Rough  Negative    21. Mugwort, Common  2+    22. Ash mix  2+    23. Birch mix  Negative    24. Beech American  Negative    25. Box, Elder  2+    26. Cedar, red  Negative    27. Cottonwood, [place]  Negative    28. Elm mix  Negative    29. Hickory mix  2+    30. Maple mix  2+    31. Oak, [place] mix  3+    32. Pecan Pollen  3+    33. Pine mix  Negative    34. Sycamore Eastern  Negative    35. [place], Black Pollen  Negative    36. Alternaria alternata  Negative    37. Cladosporium Herbarum  Negative    38. Aspergillus mix  Negative    39. Penicillium mix  Negative    40. Bipolaris sorokiniana (Helminthosporium)  2+    41. Drechslera spicifera (Curvularia)  2+    42. Mucor plumbeus  2+    43. Fusarium moniliforme  2+    44. Aureobasidium pullulans (pullulara)  Negative    45. Rhizopus oryzae  Negative    46. Botrytis cinera  Negative    47. Epicoccum nigrum  Negative    48. Phoma betae  Negative    49. Candida Albicans  Negative    50. Trichophyton mentagrophytes  Negative    51. Mite, D Farinae  5,000 AU/ml  Negative    52. Mite, D Pteronyssinus  5,000 AU/ml  Negative    53. Cat Hair 10,000 BAU/ml  Negative    54.  Dog Epithelia  Negative    55. Mixed Feathers  Negative    56. Horse Epithelia  Negative    57. Cockroach, German  Negative    58.  Mouse  Negative    59. Tobacco Leaf  2+     Food Perc - 07/23/18 1412    Time Antigen Placed  1412    Allergen Manufacturer  Lavella Hammock    Location  Back    Number of allergen test  10    Food  Select    1. Peanut  Negative    2. Soybean food  --   +/-   3. Wheat, whole  --   +/-   4. Sesame  Negative    5. Milk, cow  Negative    6. Egg White, chicken  Negative    7. Casein  Negative    8. Shellfish mix  Negative    9. Fish mix  Negative    10. Cashew  Negative     Intradermal - 07/23/18 1517    Time Antigen Placed  1517    Allergen Manufacturer  Lavella Hammock  Location  Arm    Number of Test  9    Intradermal  Select    Control  Negative    [place]  1+    Ragweed mix  Negative    Mold 1  Negative    Mold 2  Negative    Cat  Negative    Dog  Negative    Cockroach  Negative    Mite mix  Negative       Allergy testing results were read and interpreted by myself, documented by clinical staff.         Salvatore Marvel, MD [facility]

## 2018-07-23 NOTE — Patient Instructions (Addendum)
1. Itching - Your history does not have any "red flags" such as fevers, joint pains, or permanent skin changes that would be concerning for a more serious cause of hives.  - We will get some labs to rule out serious causes of hives: complete blood count, tryptase level, alpha gal panel, CMP, ESR, and CRP. - Testing revealed multiple sensitizations including grasses, weeds, trees, indoor and outdoor molds, and tobacco leaf (avoidance measures provided).  - Chronic hives are often times a self limited process and will "burn themselves out" over 6-12 months, although this is not always the case.  - In the meantime, start suppressive dosing of antihistamines:   - Morning: Zyrtec (cetirizine) 10-71m (one to two tablets)  - Evening: Zyrtec (cetirizine) 10-211m(one to two tablets) - You can change this dosing at home, decreasing the dose as needed or increasing the dosing as needed.   2. Perennial and seasonal allergic rhinitis - Testing today showed: tobacco, grasses, weeds, trees, indoor molds and outdoor molds - Copy of test results provided.  - Avoidance measures provided - Continue with: Flonase (fluticasone) one spray per nostril daily - You can use an extra dose of the antihistamine, if needed, for breakthrough symptoms.  - Consider nasal saline rinses 1-2 times daily to remove allergens from the nasal cavities as well as help with mucous clearance (this is especially helpful to do before the nasal sprays are given) - Consider allergy shots as a means of long-term control.  3. Return in about 2 months (around 09/22/2018). This can be an in-person, a virtual Webex or a telephone follow up visit.   Please inform us[facility]oreaf any Emergency Department visits, hospitalizations, or changes in symptoms. Call usKoreaefore going to the ED for breathing or allergy symptoms since we might be able to fit you in for a sick visit. Feel free to contact usKoreanytime with any questions, problems, or concerns.  It was a  pleasure to meet you today!  Websites that have reliable patient information: 1. American Academy of Asthma, Allergy, and Immunology: www.aaaai.org 2. Food Allergy Research and Education (FARE): foodallergy.org 3. Mothers of Asthmatics: http://www.asthmacommunitynetwork.org 4. American College of Allergy, Asthma, and Immunology: www.acaai.org  "Like" [facility] Facebook and Instagram for our latest updates!      Make sure you are registered to vote! If you have moved or changed any of your contact information, you will need to get this updated before voting!    Voter ID laws are NOT going into effect for the General Election in November 2020! DO NOT let this stop you from exercising your right to vote!     Reducing Pollen Exposure  The American Academy of Allergy, Asthma and Immunology suggests the following steps to reduce your exposure to pollen during allergy seasons.    1. Do not hang sheets or clothing out to dry; pollen may collect on these items. 2. Do not mow lawns or spend time around freshly cut grass; mowing stirs up pollen. 3. Keep windows closed at night.  Keep car windows closed while driving. 4. Minimize morning activities outdoors, a time when pollen counts are usually at their highest. 5. Stay indoors as much as possible when pollen counts or humidity is high and on windy days when pollen tends to remain in the air longer. 6. Use air conditioning when possible.  Many air conditioners have filters that trap the pollen spores. 7. Use a HEPA room air filter to remove pollen form the indoor air  you breathe.  Control of Mold Allergen   Mold and fungi can grow on a variety of surfaces provided certain temperature and moisture conditions exist.  Outdoor molds grow on plants, decaying vegetation and soil.  The major outdoor mold, Alternaria and Cladosporium, are found in very high numbers during hot and dry conditions.  Generally, a late Summer - Fall peak is seen for common  outdoor fungal spores.  Rain will temporarily lower outdoor mold spore count, but counts rise rapidly when the rainy period ends.  The most important indoor molds are Aspergillus and Penicillium.  Dark, humid and poorly ventilated basements are ideal sites for mold growth.  The next most common sites of mold growth are the bathroom and the kitchen.  Outdoor (Seasonal) Mold Control  Positive outdoor molds via skin testing: Bipolaris (Helminthsporium), Drechslera (Curvalaria) and Mucor  1. Use air conditioning and keep windows closed 2. Avoid exposure to decaying vegetation. 3. Avoid leaf raking. 4. Avoid grain handling. 5. Consider wearing a face mask if working in moldy areas.  6.   Indoor (Perennial) Mold Control   Positive indoor molds via skin testing: Fusarium  1. Maintain humidity below 50%. 2. Clean washable surfaces with 5% bleach solution. 3. Remove sources e.g. contaminated carpets.     Allergy Shots   Allergies are the result of a chain reaction that starts in the immune system. Your immune system controls how your body defends itself. For instance, if you have an allergy to pollen, your immune system identifies pollen as an invader or allergen. Your immune system overreacts by producing antibodies called Immunoglobulin E (IgE). These antibodies travel to cells that release chemicals, causing an allergic reaction.  The concept behind allergy immunotherapy, whether it is received in the form of shots or tablets, is that the immune system can be desensitized to specific allergens that trigger allergy symptoms. Although it requires time and patience, the payback can be long-term relief.  How Do Allergy Shots Work?  Allergy shots work much like a vaccine. Your body responds to injected amounts of a particular allergen given in increasing doses, eventually developing a resistance and tolerance to it. Allergy shots can lead to decreased, minimal or no allergy symptoms.  There  generally are two phases: build-up and maintenance. Build-up often ranges from three to six months and involves receiving injections with increasing amounts of the allergens. The shots are typically given once or twice a week, though more rapid build-up schedules are sometimes used.  The maintenance phase begins when the most effective dose is reached. This dose is different for each person, depending on how allergic you are and your response to the build-up injections. Once the maintenance dose is reached, there are longer periods between injections, typically two to four weeks.  Occasionally doctors give cortisone-type shots that can temporarily reduce allergy symptoms. These types of shots are different and should not be confused with allergy immunotherapy shots.  Who Can Be Treated with Allergy Shots?  Allergy shots may be a good treatment approach for people with allergic rhinitis (hay fever), allergic asthma, conjunctivitis (eye allergy) or stinging insect allergy.   Before deciding to begin allergy shots, you should consider:  . The length of allergy season and the severity of your symptoms . Whether medications and/or changes to your environment can control your symptoms . Your desire to avoid long-term medication use . Time: allergy immunotherapy requires a major time commitment . Cost: may vary depending on your insurance coverage  Allergy shots  for children age 35 and older are effective and often well tolerated. They might prevent the onset of new allergen sensitivities or the progression to asthma.  Allergy shots are not started on patients who are pregnant but can be continued on patients who become pregnant while receiving them. In some patients with other medical conditions or who take certain common medications, allergy shots may be of risk. It is important to mention other medications you talk to your allergist.   When Will I Feel Better?  Some may experience decreased  allergy symptoms during the build-up phase. For others, it may take as long as 12 months on the maintenance dose. If there is no improvement after a year of maintenance, your allergist will discuss other treatment options with you.  If you aren't responding to allergy shots, it may be because there is not enough dose of the allergen in your vaccine or there are missing allergens that were not identified during your allergy testing. Other reasons could be that there are high levels of the allergen in your environment or major exposure to non-allergic triggers like tobacco smoke.  What Is the Length of Treatment?  Once the maintenance dose is reached, allergy shots are generally continued for three to five years. The decision to stop should be discussed with your allergist at that time. Some people may experience a permanent reduction of allergy symptoms. Others may relapse and a longer course of allergy shots can be considered.  What Are the Possible Reactions?  The two types of adverse reactions that can occur with allergy shots are local and systemic. Common local reactions include very mild redness and swelling at the injection site, which can happen immediately or several hours after. A systemic reaction, which is less common, affects the entire body or a particular body system. They are usually mild and typically respond quickly to medications. Signs include increased allergy symptoms such as sneezing, a stuffy nose or hives.  Rarely, a serious systemic reaction called anaphylaxis can develop. Symptoms include swelling in the throat, wheezing, a feeling of tightness in the chest, nausea or dizziness. Most serious systemic reactions develop within 30 minutes of allergy shots. This is why it is strongly recommended you wait in your doctor's office for 30 minutes after your injections. Your allergist is trained to watch for reactions, and his or her staff is trained and equipped with the proper  medications to identify and treat them.  Who Should Administer Allergy Shots?  The preferred location for receiving shots is your prescribing allergist's office. Injections can sometimes be given at another facility where the physician and staff are trained to recognize and treat reactions, and have received instructions by your prescribing allergist.

## 2018-07-25 LAB — THYROID ANTIBODIES
Thyroglobulin Antibody: 312.9 IU/mL — ABNORMAL HIGH (ref 0.0–0.9)
Thyroperoxidase Ab SerPl-aCnc: 57 IU/mL — ABNORMAL HIGH (ref 0–34)

## 2018-07-25 LAB — C-REACTIVE PROTEIN: CRP: 6 mg/L (ref 0–10)

## 2018-07-25 LAB — ALLERGEN, WHEAT, F4: Wheat IgE: <0.1 kU/L

## 2018-07-25 LAB — ANA W/REFLEX IF POSITIVE: Anti Nuclear Antibody (ANA): NEGATIVE

## 2018-07-25 LAB — ALLERGEN SOYBEAN: Soybean IgE: <0.1 kU/L

## 2018-07-25 LAB — TRYPTASE: Tryptase: 3.5 ug/L (ref 2.2–13.2)

## 2018-07-25 LAB — SEDIMENTATION RATE: Sed Rate: 45 mm/hr — ABNORMAL HIGH (ref 0–40)

## 2018-09-24 ENCOUNTER — Ambulatory Visit: Payer: Medicare Other | Admitting: Allergy & Immunology

## 2018-09-24 ENCOUNTER — Ambulatory Visit (INDEPENDENT_AMBULATORY_CARE_PROVIDER_SITE_OTHER): Payer: Medicare Other | Admitting: Allergy & Immunology

## 2018-09-24 ENCOUNTER — Other Ambulatory Visit: Payer: Self-pay

## 2018-09-24 ENCOUNTER — Encounter: Payer: Self-pay | Admitting: Allergy & Immunology

## 2018-09-24 DIAGNOSIS — R768 Other specified abnormal immunological findings in serum: Secondary | ICD-10-CM

## 2018-09-24 MED ORDER — FEXOFENADINE HCL 180 MG PO TABS: 180.0000 mg | ORAL_TABLET | Freq: Every day | ORAL | 5 refills | Status: DC

## 2018-09-24 NOTE — Progress Notes (Signed)
RE: Cassie Johnson MRN: [id number] DOB: 1945-10-07 Date of Telemedicine Visit: 09/24/2018  Referring provider: Chip Boer, PA-C Primary care provider: Abran Richard, MD  Chief Complaint: Allergic Rhinitis  (Televisit at home. Patient gave verbal consent to treat and bill insurance for this visit)   Telemedicine Follow Up Visit via Telephone: I connected with Cassie Johnson for a follow up on 09/24/18 by telephone and verified that I am speaking with the correct person using two identifiers.   I discussed the limitations, risks, security and privacy concerns of performing an evaluation and management service by telephone and the availability of in person appointments. I also discussed with the patient that there may be a patient responsible charge related to this service. The patient expressed understanding and agreed to proceed.  Patient is at home.  Provider is at the office.  Visit start time: 2:56 PM Visit end time: 3:11 PM Insurance consent/check in by: [facility] consent and medical assistant/nurse: Caryl Pina  History of Present Illness:  She is a 73 y.o. female, who is being followed for persistent itching and perennial and seasonal allergic rhinitis. Her previous allergy office visit was in May 2020 with myself.  She was last seen as a new patient in May 2020.  At that time, she had a history that was positive to tobacco, grasses, weeds, trees, and indoor and outdoor molds.  We continued Flonase.  For her itching, we obtained lab work and started Zyrtec 1 to 2 tablets up to twice daily.  Since the last visit, she reports that she is continuing to have some itching. She is on the cetirizine once daily. This apparently causes worsening function when she takes more than that. She has not tried Human resources officer.   She is not avoiding wheat, but she has cut back on it. She did stop cereal completely. She went back to corn flakes and rice krispies. She does eat a slide of bread very  occasionally. She will very rarely eats red meat, maybe less than once every other. She does not drink milk, but rarely eats cheese. She does not not eat yogurt. She does go outside all of the time to tend her garden.   She did tell her PCP about the thyroid antibodies. She has not had thyroid function performed. She does not know whether her thyroid function is tested annually. Her PCP is not in the system. There are no thyroid studies in system at all either.   Otherwise, there have been no changes to her past medical history, surgical history, family history, or social history.  Assessment and Plan:  [name] is a 73 y.o. 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 Itching - We will get thyroid function today as well as an alpha gal panel.  - We will call you in 1-2 weeks with the results of the testing.  - In the meantime, start suppressive dosing of antihistamines:   - Morning: Allegra (fexofenadine) 180mg   - Evening: Zyrtec (cetirizine) 10mg   - You can change this dosing at home, decreasing the dose as needed or increasing the dosing as needed.  - Consider Xolair if needed in the future.   2. Perennial and seasonal allergic rhinitis (tobacco, grasses, weeds, trees, indoor molds and outdoor molds) - Continue with: Flonase (fluticasone) one spray per  nostril daily - You can use an extra dose of the antihistamine, if needed, for breakthrough symptoms.   - Consider nasal saline rinses 1-2 times daily to remove allergens from the nasal cavities as well as help with mucous clearance (this is especially helpful to do before the nasal sprays are given) - Consider allergy shots as a means of long-term control (information on allergen immunotherapy provided).   3. Return in about  3 months (around 12/25/2018). This can be an in-person, a virtual Webex or a telephone follow up visit.   Diagnostics: None.  Medication List:  Current Outpatient Medications  Medication Sig Dispense Refill  . B Complex Vitamins (VITAMIN B COMPLEX PO) Take by mouth.    Marland Kitchen FLUTICASONE PROPIONATE NA Place into the nose.    . latanoprost (XALATAN) 0.005 % ophthalmic solution     . lisinopril (PRINIVIL,ZESTRIL) 5 MG tablet Take 5 mg by mouth daily.    . naproxen (NAPROSYN) 500 MG tablet naproxen 500 mg tablet  Take 1 tablet twice a day by oral route for 15 days.     No current facility-administered medications for this visit.    Allergies: Allergies  Allergen Reactions  . Prednisone   . Sulfa Drugs Cross Reactors    I reviewed her past medical history, social history, family history, and environmental history and no significant changes have been reported from previous visits.  Review of Systems  Constitutional: Negative for activity change and appetite change.  HENT: Negative for congestion, postnasal drip, rhinorrhea, sinus pressure and sore throat.   Eyes: Negative for pain, discharge, redness and itching.  Respiratory: Negative for shortness of breath, wheezing and stridor.   Gastrointestinal: Negative for diarrhea, nausea and vomiting.  Musculoskeletal: Negative for arthralgias, joint swelling and myalgias.  Skin: Positive for rash.       Positive for itching.  Allergic/Immunologic: Negative for environmental allergies and food allergies.    Objective:  Physical exam not obtained as encounter was done via telephone.   Previous notes and tests were reviewed.  I discussed the assessment and treatment plan with the patient. The patient was provided an opportunity to ask questions and all were answered. The patient agreed with the plan and demonstrated an understanding of the instructions.   The patient was advised to call back or seek an in-person evaluation if the symptoms  worsen or if the condition fails to improve as anticipated.  I provided 15 minutes of non-face-to-face time during this encounter.  It was my pleasure to participate in Helena Balon's care today. Please feel free to contact me with any questions or concerns.   Sincerely,  Valentina Shaggy, MD

## 2018-09-24 NOTE — Patient Instructions (Addendum)
1. Itching - We will get thyroid function today as well as an alpha gal panel.  - We will call you in 1-2 weeks with the results of the testing.  - In the meantime, start suppressive dosing of antihistamines:   - Morning: Allegra (fexofenadine) 180mg   - Evening: Zyrtec (cetirizine) 10mg   - You can change this dosing at home, decreasing the dose as needed or increasing the dosing as needed.  - Consider Xolair if needed in the future.   2. Perennial and seasonal allergic rhinitis (tobacco, grasses, weeds, trees, indoor molds and outdoor molds) - Continue with: Flonase (fluticasone) one spray per nostril daily - You can use an extra dose of the antihistamine, if needed, for breakthrough symptoms.   - Consider nasal saline rinses 1-2 times daily to remove allergens from the nasal cavities as well as help with mucous clearance (this is especially helpful to do before the nasal sprays are given) - Consider allergy shots as a means of long-term control (information on allergen immunotherapy provided).   3. Return in about 3 months (around 12/25/2018). This can be an in-person, a virtual Webex or a telephone follow up visit.   Please inform us of any Emergency Department visits, hospitalizations, or changes in symptoms. Call us before going to the ED for breathing or allergy symptoms since we might be able to fit you in for a sick visit. Feel free to contact us anytime with any questions, problems, or concerns.  It was a pleasure to talk to you today today!  Websites that have reliable patient information: 1. American Academy of Asthma, Allergy, and Immunology: www.aaaai.org 2. Food Allergy Research and Education (FARE): foodallergy.org 3. Mothers of Asthmatics: http://www.asthmacommunitynetwork.org 4. American College of Allergy, Asthma, and Immunology: www.acaai.org  "Like" [place] on Facebook and Instagram for our latest updates!      Make sure you are registered to vote! If you have moved or  changed any of your contact information, you will need to get this updated before voting!  In some cases, you MAY be able to register to vote online: [email or web address]    Voter ID laws are NOT going into effect for the General Election in November 2020! DO NOT let this stop you from exercising your right to vote!   Absentee voting is the SAFEST way to vote during the coronavirus pandemic!   Download and print an absentee ballot request form at [email or web address] or you can scan the QR code below with your smart phone:      More information on absentee ballots can be found here: [email or web address]

## 2018-10-08 LAB — ALPHA-GAL PANEL
Alpha Gal IgE*: <0.1 kU/L (ref <0.10)
Beef (Bos spp) IgE: <0.1 kU/L (ref <0.35)
Class Interpretation: 0
Class Interpretation: 0
Class Interpretation: 0
Lamb/Mutton (Ovis spp) IgE: <0.1 kU/L (ref <0.35)
Pork (Sus spp) IgE: <0.1 kU/L (ref <0.35)

## 2018-10-08 LAB — THYROID PANEL WITH TSH
Free Thyroxine Index: 1.7 (ref 1.2–4.9)
T3 Uptake Ratio: 29 % (ref 24–39)
T4, Total: 5.9 ug/dL (ref 4.5–12.0)
TSH: 1.8 u[IU]/mL (ref 0.450–4.500)

## 2018-12-31 ENCOUNTER — Other Ambulatory Visit: Payer: Self-pay

## 2018-12-31 ENCOUNTER — Encounter: Payer: Self-pay | Admitting: Allergy & Immunology

## 2018-12-31 ENCOUNTER — Ambulatory Visit (INDEPENDENT_AMBULATORY_CARE_PROVIDER_SITE_OTHER): Payer: Medicare Other | Admitting: Allergy & Immunology

## 2018-12-31 VITALS — BP 138/78 | HR 78 | Temp 100.4°F | Resp 18

## 2018-12-31 DIAGNOSIS — R768 Other specified abnormal immunological findings in serum: Secondary | ICD-10-CM | POA: Diagnosis not present

## 2018-12-31 DIAGNOSIS — L299 Pruritus, unspecified: Secondary | ICD-10-CM

## 2018-12-31 DIAGNOSIS — L501 Idiopathic urticaria: Secondary | ICD-10-CM | POA: Diagnosis not present

## 2018-12-31 DIAGNOSIS — J3089 Other allergic rhinitis: Secondary | ICD-10-CM

## 2018-12-31 DIAGNOSIS — J302 Other seasonal allergic rhinitis: Secondary | ICD-10-CM

## 2018-12-31 NOTE — Patient Instructions (Addendum)
1. Itching/urticaria  -We are going to go ahead and submit for Xolair approval for your hives. - Xolair sample provided today.  - In the meantime, continue with suppressive dosing of antihistamines:   - Morning: Allegra (fexofenadine) 180mg   - Evening: Zyrtec (cetirizine) 10mg   - Tammy will be reaching out to you to discuss any forms that need to be filled out.   2. Perennial and seasonal allergic rhinitis (tobacco, grasses, weeds, trees, indoor molds and outdoor molds) - Continue with: Flonase (fluticasone) one spray per nostril daily - You can use an extra dose of the antihistamine, if needed, for breakthrough symptoms.   - Consider nasal saline rinses 1-2 times daily to remove allergens from the nasal cavities as well as help with mucous clearance (this is especially helpful to do before the nasal sprays are given) - Consider allergy shots as a means of long-term control (information on allergen immunotherapy provided).   3. Return in about 3 months (around 04/02/2019). This can be an in-person, a virtual Webex or a telephone follow up visit.   Please inform us of any Emergency Department visits, hospitalizations, or changes in symptoms. Call us before going to the ED for breathing or allergy symptoms since we might be able to fit you in for a sick visit. Feel free to contact us anytime with any questions, problems, or concerns.  It was a pleasure to talk to you today today!  Websites that have reliable patient information: 1. American Academy of Asthma, Allergy, and Immunology: www.aaaai.org 2. Food Allergy Research and Education (FARE): foodallergy.org 3. Mothers of Asthmatics: http://www.asthmacommunitynetwork.org 4. American College of Allergy, Asthma, and Immunology: www.acaai.org  "Like" [place] on Facebook and Instagram for our latest updates!

## 2018-12-31 NOTE — Progress Notes (Signed)
FOLLOW UP  Date of Service/Encounter:  12/31/18   Assessment:   Itching  Allergic urticaria- with multiple environmental sensitizations as well as mildly reactive skin testing to wheat and soy (no improvement with avoidance of these foods)  Perennial and seasonal allergic rhinitis (tobacco, grasses, weeds, trees, indoor molds and outdoor molds)  Plan/Recommendations:   1. Itching/urticaria  -We are going to go ahead and submit for Xolair approval for your hives. - Xolair sample provided today.  - In the meantime, continue with suppressive dosing of antihistamines:   - Morning: Allegra (fexofenadine) 180mg   - Evening: Zyrtec (cetirizine) 10mg   - Tammy will be reaching out to you to discuss any forms that need to be filled out.   2. Perennial and seasonal allergic rhinitis (tobacco, grasses, weeds, trees, indoor molds and outdoor molds) - Continue with: Flonase (fluticasone) one spray per nostril daily - You can use an extra dose of the antihistamine, if needed, for breakthrough symptoms.   - Consider nasal saline rinses 1-2 times daily to remove allergens from the nasal cavities as well as help with mucous clearance (this is especially helpful to do before the nasal sprays are given) - Consider allergy shots as a means of long-term control (information on allergen immunotherapy provided).   3. Return in about 3 months (around 04/02/2019). This can be an in-person, a virtual Webex or a telephone follow up visit.  Subjective:   Cassie Johnson is a 73 y.o. female presenting today for follow up of  Chief Complaint  Patient presents with  . Pruritis    Zyrtec worked for a month but itching has returned. Wakes up itching.     Cassie Johnson has a history of the following: Patient Active Problem List   Diagnosis Date Noted  . Allergic urticaria 07/23/2018  . Seasonal and perennial allergic rhinitis 07/23/2018  . Spinal stenosis of lumbar region 06/21/2011  . Lumbar  herniated disc 06/06/2011  . SCIATICA 08/01/2009  . LUMBAR SPRAIN AND STRAIN 08/01/2009  . HIGH BLOOD PRESSURE 08/01/2009    History obtained from: chart review and patient.  Cassie Johnson is a 73 y.o. female presenting for a follow up visit. We last saw her in July 2020. At that time, we did get some thyroid testing as well as an alpha gal panel. We continued her on suppressive dosing of antihistamines including Allegra one tablet in the morning and Zyrtec 10mg  at night. For her P/SAR, we continued with fluticasone daily. All of the labs we did at that visit were negative.   In the interim, she has continued to have some urticaria on a nearly daily basis despite the antihistamines. She has not had any joint pain whatsoever. These urticaria have come and gone, without any permanent skin changes. She has not not noticed a change with her avoidance of wheat and soy; therefore she introduced these again without any worsening of her symptoms.   She is interested in getting something else on board because this is altering her life. We did spend plenty of time talking about Xolair. She is open to this idea. Allergic rhinitis is controlled with fluticasone one spray per nostril daily. She has not required any antibiotics whatsoever.   Otherwise, there have been no changes to her past medical history, surgical history, family history, or social history.    Review of Systems  Constitutional: Negative.  Negative for chills, fever, malaise/fatigue and weight loss.  HENT: Negative.  Negative for congestion, ear discharge, ear pain and  sore throat.   Eyes: Negative for pain, discharge and redness.  Respiratory: Negative for cough, sputum production, shortness of breath and wheezing.   Cardiovascular: Negative.  Negative for chest pain, palpitations, orthopnea and claudication.  Gastrointestinal: Negative for abdominal pain, constipation, diarrhea, heartburn, nausea and vomiting.  Skin: Positive for itching and  rash.  Neurological: Negative for dizziness and headaches.  Endo/Heme/Allergies: Negative for environmental allergies. Does not bruise/bleed easily.       Objective:   Blood pressure 138/78, pulse 78, temperature (!) 100.4 F (38 C), temperature source Temporal, resp. rate 18, SpO2 98 %. There is no height or weight on file to calculate BMI.   Physical Exam:  Physical Exam  Constitutional: She appears well-developed.  HENT:  Head: Normocephalic and atraumatic.  Right Ear: Tympanic membrane, external ear and ear canal normal.  Left Ear: Tympanic membrane and ear canal normal.  Nose: No mucosal edema, rhinorrhea, nasal deformity or septal deviation. No epistaxis. Right sinus exhibits no maxillary sinus tenderness and no frontal sinus tenderness. Left sinus exhibits no maxillary sinus tenderness and no frontal sinus tenderness.  Mouth/Throat: Uvula is midline and oropharynx is clear and moist. Mucous membranes are not pale and not dry.  Eyes: Pupils are equal, round, and reactive to light. Conjunctivae and EOM are normal. Right eye exhibits no chemosis and no discharge. Left eye exhibits no chemosis and no discharge. Right conjunctiva is not injected. Left conjunctiva is not injected.  Cardiovascular: Normal rate, regular rhythm and normal heart sounds.  Respiratory: Effort normal and breath sounds normal. No accessory muscle usage. No tachypnea. No respiratory distress. She has no wheezes. She has no rhonchi. She has no rales. She exhibits no tenderness.  Moving air well in all lung fields. No increased work of breathing noted.   Lymphadenopathy:    She has no cervical adenopathy.  Neurological: She is alert.  Skin: No abrasion, no petechiae and no rash noted. Rash is not papular, not vesicular and not urticarial. No erythema. No pallor.  She does have some urticarial lesions on her bilateral feet and ankles. She also has some on her lower arms.   Psychiatric: She has a normal mood and  affect.     Diagnostic studies: none   Xolair 300mg  sample given in clinic today.    Salvatore Marvel, MD  [facility]

## 2019-01-01 ENCOUNTER — Encounter: Payer: Self-pay | Admitting: Allergy & Immunology

## 2019-01-01 ENCOUNTER — Telehealth: Payer: Self-pay | Admitting: *Deleted

## 2019-01-01 NOTE — Telephone Encounter (Signed)
L/M for patient to contact me so I can advise that due to her Ins we will buy and bill thru medical [facility] and supplement so she wont have any copay for drug as long as she has the same coverage

## 2019-01-01 NOTE — Telephone Encounter (Signed)
Patient advised of B&B already has next appt set up

## 2019-01-03 MED ORDER — OMALIZUMAB 150 MG ~~LOC~~ SOLR
300.0000 mg | SUBCUTANEOUS | Status: DC
  Administered 2018-12-31 – 2019-03-25 (×4): 300 mg via SUBCUTANEOUS

## 2019-01-03 NOTE — Addendum Note (Signed)
Addended by: Carin Hock on: 01/03/2019 05:38 PM   Modules accepted: Orders

## 2019-01-26 DIAGNOSIS — L501 Idiopathic urticaria: Secondary | ICD-10-CM | POA: Diagnosis not present

## 2019-01-28 ENCOUNTER — Other Ambulatory Visit: Payer: Self-pay

## 2019-01-28 ENCOUNTER — Ambulatory Visit (INDEPENDENT_AMBULATORY_CARE_PROVIDER_SITE_OTHER): Payer: Medicare Other

## 2019-01-28 DIAGNOSIS — L501 Idiopathic urticaria: Secondary | ICD-10-CM | POA: Diagnosis not present

## 2019-01-28 MED ORDER — EPINEPHRINE 0.3 MG/0.3ML IJ SOAJ: 0.3000 mg | Freq: Once | INTRAMUSCULAR | 1 refills | Status: DC | PRN

## 2019-02-24 DIAGNOSIS — L501 Idiopathic urticaria: Secondary | ICD-10-CM | POA: Diagnosis not present

## 2019-02-25 ENCOUNTER — Ambulatory Visit (INDEPENDENT_AMBULATORY_CARE_PROVIDER_SITE_OTHER): Payer: Medicare Other

## 2019-02-25 DIAGNOSIS — L501 Idiopathic urticaria: Secondary | ICD-10-CM | POA: Diagnosis not present

## 2019-03-24 DIAGNOSIS — L501 Idiopathic urticaria: Secondary | ICD-10-CM | POA: Diagnosis not present

## 2019-03-25 ENCOUNTER — Ambulatory Visit (INDEPENDENT_AMBULATORY_CARE_PROVIDER_SITE_OTHER): Payer: Medicare Other

## 2019-03-25 DIAGNOSIS — L501 Idiopathic urticaria: Secondary | ICD-10-CM | POA: Diagnosis not present

## 2019-04-03 ENCOUNTER — Encounter: Payer: Self-pay | Admitting: Allergy & Immunology

## 2019-04-03 ENCOUNTER — Other Ambulatory Visit: Payer: Self-pay

## 2019-04-03 ENCOUNTER — Ambulatory Visit (INDEPENDENT_AMBULATORY_CARE_PROVIDER_SITE_OTHER): Payer: Medicare Other | Admitting: Allergy & Immunology

## 2019-04-03 DIAGNOSIS — L501 Idiopathic urticaria: Secondary | ICD-10-CM | POA: Diagnosis not present

## 2019-04-03 DIAGNOSIS — J3089 Other allergic rhinitis: Secondary | ICD-10-CM

## 2019-04-03 DIAGNOSIS — R768 Other specified abnormal immunological findings in serum: Secondary | ICD-10-CM

## 2019-04-03 DIAGNOSIS — Z87891 Personal history of nicotine dependence: Secondary | ICD-10-CM

## 2019-04-03 DIAGNOSIS — J302 Other seasonal allergic rhinitis: Secondary | ICD-10-CM

## 2019-04-03 MED ORDER — DOXEPIN HCL 25 MG PO CAPS: 25.0000 mg | ORAL_CAPSULE | Freq: Every day | ORAL | 5 refills | Status: DC

## 2019-04-03 MED ORDER — MONTELUKAST SODIUM 10 MG PO TABS: 10.0000 mg | ORAL_TABLET | Freq: Every day | ORAL | 5 refills | Status: DC

## 2019-04-03 NOTE — Progress Notes (Signed)
RE: Cassie Johnson MRN: [id number] DOB: 1946-02-18 Date of Telemedicine Visit: 04/03/2019  Referring provider: Abran Richard, MD Primary care provider: Abran Richard, MD  Chief Complaint: Urticaria (Televisit at home. Patient gave verbal consent to treat and bill insurance.)   Telemedicine Follow Up Visit via Telephone: I connected with Cassie Johnson for a follow up on 04/03/19 by telephone and verified that I am speaking with the correct person using two identifiers.   I discussed the limitations, risks, security and privacy concerns of performing an evaluation and management service by telephone and the availability of in person appointments. I also discussed with the patient that there may be a patient responsible charge related to this service. The patient expressed understanding and agreed to proceed.  Patient is at home accompanied by her sister who provided/contributed to the history.  Provider is at the office.  Visit start time: 3:02 PM Visit end time: 3:15 PM Insurance consent/check in by: [place] consent and medical assistant/nurse: Caryl Pina  History of Present Illness:  She is a 74 y.o. female, who is being followed for chronic urticaria. Her previous allergy office visit was in November 2020 with myself.  At that visit, her main complaint was regarding her urticaria.  We started Allegra 1 in the morning and Zyrtec 1 at night.  We did discuss starting Xolair as well and she decided to go ahead and start this.  We did give her a sample of it on that day.  For her rhinitis, we continued Flonase.  We also discussed allergen immunotherapy as a means of long-term control, but she lives in [place] which limits her ability to get here weekly for shots. Her workup for urticaria was notable only for elevated anti-thyroid antibodies.   Since last visit, she has received multiple doses Xolair (November 2020, December 2020 x2, and January 2021). In total, she has received four doses  of Xolair. She does not feel that there is much of a difference. She continues to have the same urticaria as before. She is on the Allegra in the morning and the Zyrtec at night. As long as she remains on these, she will have some relief. However, she will have occasional breakthroughs. She would like to get off of the antihistamines completely, however.   She is having no throat swelling with these episodes. Allergic rhinitis symptoms are stable with the nasal steroid. Her sister interestingly has similar urticarial outbreaks and she has not had a cause elucidated at this point. Her brother recently passed due to complications of diabetes.   Otherwise, there have been no changes to her past medical history, surgical history, family history, or social history.  Assessment and Plan:  Cassie Johnson is a 74 y.o. female with:   Itching  Allergic urticaria- with multiple environmental sensitizations as well as mildly reactive skin testing to wheat and soy (no improvement with avoidance of these foods)  Perennial and seasonal allergic rhinitis (tobacco, grasses, weeds, trees, indoor molds and outdoor molds)   Unfortunately, Cassie Johnson urticaria are no well controlled. She continues to need rescue medication despite the BID antihistamine dosing and the Xolair. She has not felt that the Xolair has provided much in the way of relief. She will still have occasional outbreaks despite taking the medications on a daily basis. She wants to do better and ideally she would like to be off of the daily medications completely. There are no new exposures at all. We are going to change the dosing to every two  weeks and add both doxepin at night and montelukast. Side effects discussed and she would like to proceed with the plan.   Diagnostics: None.  Medication List:  Current Outpatient Medications  Medication Sig Dispense Refill  . cetirizine (ZYRTEC) 10 MG tablet Take 10 mg by mouth 2 (two) times daily.    Marland Kitchen  FLUTICASONE PROPIONATE NA Place into the nose.    . latanoprost (XALATAN) 0.005 % ophthalmic solution     . naproxen (NAPROSYN) 500 MG tablet naproxen 500 mg tablet  Take 1 tablet twice a day by oral route for 15 days.    . B Complex Vitamins (VITAMIN B COMPLEX PO) Take by mouth.    . EPINEPHrine 0.3 mg/0.3 mL IJ SOAJ injection Inject 0.3 mLs (0.3 mg total) into the muscle once as needed for up to 1 dose for anaphylaxis. (Patient not taking: Reported on 04/03/2019) 0.3 mL 1  . lisinopril (PRINIVIL,ZESTRIL) 5 MG tablet Take 5 mg by mouth daily.     Current Facility-Administered Medications  Medication Dose Route Frequency Provider Last Rate Last Admin  . omalizumab Arvid Right) injection 300 mg  300 mg Subcutaneous Q28 days Valentina Shaggy, MD   300 mg at 03/25/19 1001   Allergies: Allergies  Allergen Reactions  . Prednisone   . Sulfa Drugs Cross Reactors    I reviewed her past medical history, social history, family history, and environmental history and no significant changes have been reported from previous visits.  Review of Systems  Constitutional: Negative for activity change and appetite change.  HENT: Negative for congestion, postnasal drip, rhinorrhea, sinus pressure and sore throat.   Eyes: Negative for pain, discharge, redness and itching.  Respiratory: Negative for cough, shortness of breath, wheezing and stridor.   Gastrointestinal: Negative for diarrhea, nausea and vomiting.  Endocrine: Negative for heat intolerance.  Musculoskeletal: Negative for arthralgias, joint swelling and myalgias.  Skin: Positive for rash.       Positive for urticaria.   Allergic/Immunologic: Negative for environmental allergies and food allergies.    Objective:  Physical exam not obtained as encounter was done via telephone.   Previous notes and tests were reviewed.  I discussed the assessment and treatment plan with the patient. The patient was provided an opportunity to ask questions and  all were answered. The patient agreed with the plan and demonstrated an understanding of the instructions.   The patient was advised to call back or seek an in-person evaluation if the symptoms worsen or if the condition fails to improve as anticipated.  I provided 13 minutes of non-face-to-face time during this encounter.  It was my pleasure to participate in [place] Cassie Johnson's care today. Please feel free to contact me with any questions or concerns.   Sincerely,  Valentina Shaggy, MD

## 2019-04-03 NOTE — Patient Instructions (Signed)
1. Itching/urticaria  -We are going to go ahead and submit for Xolair approval for your hives. - Xolair sample provided today.  - In the meantime, continue with suppressive dosing of antihistamines:   - Morning: Allegra (fexofenadine) 180mg   - Evening: Zyrtec (cetirizine) 10mg  + Singulair (montelukast) 10mg  + doxepin 10mg  - Tammy will be reaching out to you to discuss any forms that need to be filled out.   2. Perennial and seasonal allergic rhinitis (tobacco, grasses, weeds, trees, indoor molds and outdoor molds) - Continue with: Flonase (fluticasone) one spray per nostril daily - You can use an extra dose of the antihistamine, if needed, for breakthrough symptoms.   - Consider nasal saline rinses 1-2 times daily to remove allergens from the nasal cavities as well as help with mucous clearance (this is especially helpful to do before the nasal sprays are given) - Consider allergy shots as a means of long-term control (information on allergen immunotherapy provided).   3. No follow-ups on file. This can be an in-person, a virtual Webex or a telephone follow up visit.   Please inform us of any Emergency Department visits, hospitalizations, or changes in symptoms. Call us before going to the ED for breathing or allergy symptoms since we might be able to fit you in for a sick visit. Feel free to contact us anytime with any questions, problems, or concerns.  It was a pleasure to talk to you today today!  Websites that have reliable patient information: 1. American Academy of Asthma, Allergy, and Immunology: www.aaaai.org 2. Food Allergy Research and Education (FARE): foodallergy.org 3. Mothers of Asthmatics: http://www.asthmacommunitynetwork.org 4. American College of Allergy, Asthma, and Immunology: www.acaai.org  "Like" [place] on Facebook and Instagram for our latest updates!

## 2019-04-16 DIAGNOSIS — L501 Idiopathic urticaria: Secondary | ICD-10-CM | POA: Diagnosis not present

## 2019-04-17 ENCOUNTER — Ambulatory Visit (INDEPENDENT_AMBULATORY_CARE_PROVIDER_SITE_OTHER): Payer: Medicare Other

## 2019-04-17 ENCOUNTER — Other Ambulatory Visit: Payer: Self-pay

## 2019-04-17 DIAGNOSIS — L501 Idiopathic urticaria: Secondary | ICD-10-CM | POA: Diagnosis not present

## 2019-04-17 MED ORDER — OMALIZUMAB 150 MG ~~LOC~~ SOLR
300.0000 mg | SUBCUTANEOUS | Status: AC
  Administered 2019-04-17 – 2019-05-20 (×3): 300 mg via SUBCUTANEOUS

## 2019-04-22 ENCOUNTER — Ambulatory Visit: Payer: Self-pay

## 2019-04-30 DIAGNOSIS — L501 Idiopathic urticaria: Secondary | ICD-10-CM | POA: Diagnosis not present

## 2019-05-01 ENCOUNTER — Other Ambulatory Visit: Payer: Self-pay

## 2019-05-01 ENCOUNTER — Ambulatory Visit (INDEPENDENT_AMBULATORY_CARE_PROVIDER_SITE_OTHER): Payer: Medicare Other

## 2019-05-01 DIAGNOSIS — L501 Idiopathic urticaria: Secondary | ICD-10-CM

## 2019-05-19 DIAGNOSIS — L501 Idiopathic urticaria: Secondary | ICD-10-CM | POA: Diagnosis not present

## 2019-05-20 ENCOUNTER — Ambulatory Visit (INDEPENDENT_AMBULATORY_CARE_PROVIDER_SITE_OTHER): Payer: Medicare Other

## 2019-05-20 ENCOUNTER — Other Ambulatory Visit: Payer: Self-pay

## 2019-05-20 DIAGNOSIS — L501 Idiopathic urticaria: Secondary | ICD-10-CM | POA: Diagnosis not present

## 2019-05-28 ENCOUNTER — Telehealth: Payer: Self-pay | Admitting: *Deleted

## 2019-05-28 NOTE — Telephone Encounter (Signed)
Patient called and stated that she would like to stop receiving her Xolair injections. She stated that she receives them twice a month an gave off the feeling that that is too much for her. Please advise.

## 2019-05-28 NOTE — Telephone Encounter (Signed)
Okay.  That is fine with me.  She might have breakthrough urticaria, but we can always restart if she wants to.  Salvatore Marvel, MD [facility]

## 2019-05-28 NOTE — Telephone Encounter (Signed)
Called and spoke to patient and she has been informed that Dr. Ernst Bowler was ok with her stopping the Xolair injections and she might have breakthrough urticaria but we she can restart if she wants. Patient expressed understanding.

## 2019-06-03 ENCOUNTER — Ambulatory Visit: Payer: Self-pay

## 2019-08-07 ENCOUNTER — Encounter: Payer: Self-pay | Admitting: "Endocrinology

## 2019-08-07 ENCOUNTER — Other Ambulatory Visit: Payer: Self-pay

## 2019-08-07 ENCOUNTER — Other Ambulatory Visit: Payer: Self-pay | Admitting: "Endocrinology

## 2019-08-07 ENCOUNTER — Ambulatory Visit (INDEPENDENT_AMBULATORY_CARE_PROVIDER_SITE_OTHER): Payer: Medicare Other | Admitting: "Endocrinology

## 2019-08-07 VITALS — BP 189/91 | HR 74 | Ht 62.0 in | Wt 160.8 lb

## 2019-08-07 DIAGNOSIS — R220 Localized swelling, mass and lump, head: Secondary | ICD-10-CM | POA: Insufficient documentation

## 2019-08-07 DIAGNOSIS — R221 Localized swelling, mass and lump, neck: Secondary | ICD-10-CM

## 2019-08-07 DIAGNOSIS — E042 Nontoxic multinodular goiter: Secondary | ICD-10-CM | POA: Diagnosis not present

## 2019-08-07 DIAGNOSIS — E063 Autoimmune thyroiditis: Secondary | ICD-10-CM | POA: Diagnosis not present

## 2019-08-07 DIAGNOSIS — I10 Essential (primary) hypertension: Secondary | ICD-10-CM | POA: Diagnosis not present

## 2019-08-07 NOTE — Progress Notes (Signed)
Endocrinology Consult Note                                            08/07/2019, 1:03 PM   Subjective:    Patient ID: Cassie Johnson, female    DOB: 1945-04-04, PCP Abran Richard, MD   Past Medical History:  Diagnosis Date  . High blood pressure   . High cholesterol   . Reflux   . Urticaria    Past Surgical History:  Procedure Laterality Date  . CARPAL TUNNEL RELEASE    . CHOLECYSTECTOMY    . VESICOVAGINAL FISTULA CLOSURE W/ TAH     Social History   Socioeconomic History  . Marital status: Widowed    Spouse name: Not on file  . Number of children: Not on file  . Years of education: Not on file  . Highest education level: Not on file  Occupational History  . Not on file  Tobacco Use  . Smoking status: Former Research scientist (life sciences)  . Smokeless tobacco: Never Used  Vaping Use  . Vaping Use: Never used  Substance and Sexual Activity  . Alcohol use: Yes  . Drug use: No  . Sexual activity: Not on file  Other Topics Concern  . Not on file  Social History Narrative  . Not on file   Social Determinants of Health   Financial Resource Strain:   . Difficulty of Paying Living Expenses:   Food Insecurity:   . Worried About Charity fundraiser in the Last Year:   . Arboriculturist in the Last Year:   Transportation Needs:   . Film/video editor (Medical):   Marland Kitchen Lack of Transportation (Non-Medical):   Physical Activity:   . Days of Exercise per Week:   . Minutes of Exercise per Session:   Stress:   . Feeling of Stress :   Social Connections:   . Frequency of Communication with Friends and Family:   . Frequency of Social Gatherings with Friends and Family:   . Attends Religious Services:   . Active Member of Clubs or Organizations:   . Attends Archivist Meetings:   Marland Kitchen Marital Status:    Family History  Problem Relation Age of Onset  . Allergic rhinitis Sister   . Hypertension Mother   . Heart failure Mother   . Asthma Neg Hx   . Eczema Neg Hx   .  Urticaria Neg Hx   . Angioedema Neg Hx   . Atopy Neg Hx   . Immunodeficiency Neg Hx    Outpatient Encounter Medications as of 08/07/2019  Medication Sig  . amLODipine (NORVASC) 10 MG tablet Take 10 mg by mouth daily.  . fexofenadine (ALLEGRA) 180 MG tablet Take 180 mg by mouth daily.  . cetirizine (ZYRTEC) 10 MG tablet Take 10 mg by mouth 2 (two) times daily.  Marland Kitchen FLUTICASONE PROPIONATE NA Place into the nose.  . latanoprost (XALATAN) 0.005 % ophthalmic solution   . montelukast (SINGULAIR) 10 MG tablet Take 1 tablet (10 mg total) by mouth at bedtime.  . [DISCONTINUED] B Complex Vitamins (VITAMIN B COMPLEX PO) Take by mouth.  . [DISCONTINUED] doxepin (SINEQUAN) 25 MG capsule Take 1 capsule (25 mg total) by mouth at bedtime for 30 doses.  . [DISCONTINUED] EPINEPHrine 0.3 mg/0.3 mL IJ SOAJ injection Inject 0.3 mLs (0.3 mg total)  into the muscle once as needed for up to 1 dose for anaphylaxis. (Patient not taking: Reported on 04/03/2019)  . [DISCONTINUED] lisinopril (PRINIVIL,ZESTRIL) 5 MG tablet Take 5 mg by mouth daily.  . [DISCONTINUED] naproxen (NAPROSYN) 500 MG tablet naproxen 500 mg tablet  Take 1 tablet twice a day by oral route for 15 days.   Facility-Administered Encounter Medications as of 08/07/2019  Medication  . omalizumab Arvid Right) injection 300 mg   ALLERGIES: Allergies  Allergen Reactions  . Prednisone   . Statins   . Sulfa Drugs Cross Reactors     VACCINATION STATUS:  There is no immunization history on file for this patient.  HPI Cassie Johnson is 74 y.o. female who presents today with a medical history as above. she is being seen in consultation for multinodular goiter requested by Abran Richard, MD. History is obtained directly from the patient.  She denies any prior history of thyroid dysfunction.  Her history is started approximately a year ago when she had sinus infection associated with cervical lymphadenopathy.  She recently underwent CT scan of the neck on June 08, 2019 which showed thyroid gland demonstrating multinodular appearance with no obvious nodularity. She did not get subsequent dedicated thyroid ultrasound. -Due to the fact that the original CT showed prominent submandibular glands bilaterally, she underwent ultrasound of neck/soft tissue which showed both submandibular glands to be prominent measuring 3.7 cm on the right and 3.5 cm on the left.  There were scattered lymph nodes around the bones submandibular regions which measure less than 1 cm short axis measurements. Patient denies any family history of thyroid malignancy, however she reports that her mother was diagnosed with unidentified lymphoma.  Her medical records reveals antithyroid antibodies including TPO and thyroglobulin antibodies elevated in May 2020.  She does not have recent thyroid function tests however August 2020 labs showed euthyroid state.  She denies palpitations, tremors. She denies dysphagia, shortness of breath, nor voice change. She denies any exposure to neck radiation. Her medical history also includes hypertension- not controlled, missed her medication this morning.    Review of Systems  Constitutional: no recent weight gain/loss, no fatigue, no subjective hyperthermia, no subjective hypothermia Eyes: no blurry vision, no xerophthalmia ENT: no sore throat, no nodules palpated in throat, no dysphagia/odynophagia, no hoarseness Cardiovascular: no Chest Pain, no Shortness of Breath, no palpitations, no leg swelling Respiratory: no cough, no shortness of breath Gastrointestinal: no Nausea/Vomiting/Diarhhea Musculoskeletal: no muscle/joint aches Skin: no rashes Neurological: no tremors, no numbness, no tingling, no dizziness Psychiatric: no depression, no anxiety  Objective:    Vitals with BMI 08/07/2019 04/03/2019 12/31/2018  Height 5\' 2"  - -  Weight 160 lbs 13 oz - -  BMI 40.3 - -  Systolic 474 (No Data) 259  Diastolic 91 (No Data) 78  Pulse 74 - 78    BP  (!) 189/91 Comment: pt had not taken her BP medication prior to visit  Pulse 74   Ht 5\' 2"  (1.575 m)   Wt 160 lb 12.8 oz (72.9 kg)   BMI 29.41 kg/m   Wt Readings from Last 3 Encounters:  08/07/19 160 lb 12.8 oz (72.9 kg)  07/23/18 156 lb 6.4 oz (70.9 kg)  04/08/14 162 lb (73.5 kg)    Physical Exam  Constitutional:  Body mass index is 29.41 kg/m.,  not in acute distress, normal state of mind Eyes: PERRLA, EOMI, no exophthalmos ENT: moist mucous membranes, + gross thyromegaly, + palpable firm left submandibular gland, enlarged  soft to firm right submandibular gland, + documented cervical lymphadenopathy bilaterally on ultrasound.   Cardiovascular: normal precordial activity, Regular Rate and Rhythm, no Murmur/Rubs/Gallops Respiratory:  adequate breathing efforts, no gross chest deformity, Clear to auscultation bilaterally Gastrointestinal: abdomen soft, Non -tender, No distension, Bowel Sounds present, no gross organomegaly Musculoskeletal: no gross deformities, strength intact in all four extremities Skin: moist, warm, no rashes Neurological: no tremor with outstretched hands, Deep tendon reflexes normal in bilateral lower extremities.    Lab Results  Component Value Date   TSH 1.800 09/30/2018    Results for Cassie Johnson, Cassie Johnson (MRN [id number]) as of 08/07/2019 12:51  Ref. Range 07/23/2018 16:01 09/30/2018 10:14  TSH Latest Ref Range: 0.450 - 4.500 uIU/mL  1.800  Thyroxine (T4) Latest Ref Range: 4.5 - 12.0 ug/dL  5.9  Free Thyroxine Index Latest Ref Range: 1.2 - 4.9   1.7  Thyroperoxidase Ab SerPl-aCnc Latest Ref Range: 0 - 34 IU/mL 57 (H)   Thyroglobulin Antibody Latest Ref Range: 0.0 - 0.9 IU/mL 312.9 (H)   T3 Uptake Ratio Latest Ref Range: 24 - 39 %  29    Ultrasound of neck/soft tissue on August 04, 2019: Prominent heterogeneous submandibular glands with prominent bilateral submandibular lymph nodes with normal-appearing morphology.  This could be secondary to inflammation or  infection.  No evidence of sialolithiasis or dilated ducts.  Assessment & Plan:   1. Multinodular goiter 2. Hashimoto's thyroiditis 3.  Enlarged bilateral submandibular glands 4.  Hypertension.  - Cassie Johnson  is being seen at a kind request of Abran Richard, MD. - I have reviewed her available thyroid and cervical sound records and clinically evaluated the patient. - Based on these reviews, she has bilaterally enlarged submandibular glands, CT findings of multinodular goiter,  however,  there is not sufficient information to proceed with definitive treatment plan.  -She will need dedicated thyroid ultrasound for better imaging of her thyroid.  If she is found to have suspicious thyroid nodules, she will be considered for fine-needle aspiration. -She will also need separate work-up for the enlarged bilateral submandibular glands, especially on the left which is firm.  She will benefit from core biopsy of one of the cervical lymph nodes, and biopsy of at least the left submandibular gland.  -This patient with Hashimoto's thyroiditis on the background, and family history of lymphoma, work-up to be completed to  Rule out malignancy.    - I did not initiate any new prescriptions today.  She would likely need additional treatment for her hypertension which was 189/91 this morning.  She missed her morning medication. - she is advised to maintain close follow up with Abran Richard, MD for primary care needs.   - Time spent with the patient: 60 minutes, of which >50% was spent in  counseling her about her multinodular goiter, Hashimoto's thyroiditis, bilateral submandibular gland enlargement and the rest in obtaining information about her symptoms, reviewing her previous labs/studies ( including abstractions from other facilities),  evaluations, and treatments,  and developing a plan to confirm diagnosis and long term treatment based on the latest standards of care/guidelines; and documenting her  care.  Cassie Johnson participated in the discussions, expressed understanding, and voiced agreement with the above plans.  All questions were answered to her satisfaction. she is encouraged to contact clinic should she have any questions or concerns prior to her return visit.  Follow up plan: Return in about 10 days (around 08/17/2019) for Thyroid / Neck Ultrasound, Labs Today- Non-Fasting  Elnita Maxwell, MD [facility] [address] [place], [place] [address] Phone: [phone]  Fax: [phone]     08/07/2019, 1:03 PM  This note was partially dictated with voice recognition software. Similar sounding words can be transcribed inadequately or may not  be corrected upon review.

## 2019-08-10 ENCOUNTER — Telehealth: Payer: Self-pay | Admitting: "Endocrinology

## 2019-08-10 NOTE — Telephone Encounter (Signed)
Cassie Johnson is calling from [facility] and states a ultra sound order was placed but Dr. Lawernce Pitts just had that ordered for the patient and it was done on 08/04/19. She states we can contact that dr office for results

## 2019-08-10 NOTE — Telephone Encounter (Signed)
I called the scheduling department back and advised them that we already had that U/S and it wasn't what Dr. Dorris Fetch had ordered, to please schedule pt . Spoke with Caryl Pina, she will schedule patient.

## 2019-08-14 LAB — T3, FREE: T3, Free: 2.8 pg/mL (ref 2.0–4.4)

## 2019-08-14 LAB — TSH: TSH: 1.68 u[IU]/mL (ref 0.450–4.500)

## 2019-08-14 LAB — T3, FREE, DIALYSIS, LC/MS-MS: T3, Free, Dialysis, LC/MS-MS: 3.42 pg/mL

## 2019-08-14 LAB — T4, FREE: Free T4: 1.09 ng/dL (ref 0.82–1.77)

## 2019-08-18 NOTE — Telephone Encounter (Signed)
Spoke with Almyra Free in scheduling dept at [facility], they will schedule patient for U/S so she can come in for the appt for f/u with Dr.Nida. Order was refaxed , spoke with pt and advised. Pt will contact us if she doesn't hear anything by tomorrow.

## 2019-08-18 NOTE — Telephone Encounter (Signed)
Pt called and states she has still not heard anything from [facility] to schedule this and her follow up is with [place] on 6/29. She is unsure what she needs to do. Requesting call back. [phone]

## 2019-08-24 ENCOUNTER — Encounter: Payer: Self-pay | Admitting: "Endocrinology

## 2019-08-25 ENCOUNTER — Other Ambulatory Visit: Payer: Self-pay

## 2019-08-25 ENCOUNTER — Encounter: Payer: Self-pay | Admitting: "Endocrinology

## 2019-08-25 ENCOUNTER — Ambulatory Visit (INDEPENDENT_AMBULATORY_CARE_PROVIDER_SITE_OTHER): Payer: Medicare Other | Admitting: "Endocrinology

## 2019-08-25 VITALS — BP 126/54 | HR 76 | Ht 62.0 in | Wt 159.8 lb

## 2019-08-25 DIAGNOSIS — R221 Localized swelling, mass and lump, neck: Secondary | ICD-10-CM | POA: Diagnosis not present

## 2019-08-25 DIAGNOSIS — R220 Localized swelling, mass and lump, head: Secondary | ICD-10-CM

## 2019-08-25 DIAGNOSIS — E042 Nontoxic multinodular goiter: Secondary | ICD-10-CM | POA: Diagnosis not present

## 2019-08-25 DIAGNOSIS — R590 Localized enlarged lymph nodes: Secondary | ICD-10-CM

## 2019-08-25 NOTE — Progress Notes (Signed)
08/25/2019, 9:49 AM   Endocrinology follow-up note  Subjective:    Patient ID: Cassie Johnson, female    DOB: [birth year], PCP Abran Richard, MD   Past Medical History:  Diagnosis Date  . High blood pressure   . High cholesterol   . Reflux   . Urticaria    Past Surgical History:  Procedure Laterality Date  . CARPAL TUNNEL RELEASE    . CHOLECYSTECTOMY    . VESICOVAGINAL FISTULA CLOSURE W/ TAH     Social History   Socioeconomic History  . Marital status: Widowed    Spouse name: Not on file  . Number of children: Not on file  . Years of education: Not on file  . Highest education level: Not on file  Occupational History  . Not on file  Tobacco Use  . Smoking status: Former Research scientist (life sciences)  . Smokeless tobacco: Never Used  Vaping Use  . Vaping Use: Never used  Substance and Sexual Activity  . Alcohol use: Yes  . Drug use: No  . Sexual activity: Not on file  Other Topics Concern  . Not on file  Social History Narrative  . Not on file   Social Determinants of Health   Financial Resource Strain:   . Difficulty of Paying Living Expenses:   Food Insecurity:   . Worried About Charity fundraiser in the Last Year:   . Arboriculturist in the Last Year:   Transportation Needs:   . Film/video editor (Medical):   Marland Kitchen Lack of Transportation (Non-Medical):   Physical Activity:   . Days of Exercise per Week:   . Minutes of Exercise per Session:   Stress:   . Feeling of Stress :   Social Connections:   . Frequency of Communication with Friends and Family:   . Frequency of Social Gatherings with Friends and Family:   . Attends Religious Services:   . Active Member of Clubs or Organizations:   . Attends Archivist Meetings:   Marland Kitchen Marital Status:    Family History  Problem Relation Age of Onset  . Allergic rhinitis Sister   . Hypertension Mother   . Heart failure Mother   . Asthma Neg Hx   . Eczema Neg Hx    . Urticaria Neg Hx   . Angioedema Neg Hx   . Atopy Neg Hx   . Immunodeficiency Neg Hx    Outpatient Encounter Medications as of 08/25/2019  Medication Sig  . amLODipine (NORVASC) 10 MG tablet Take 10 mg by mouth daily.  . cetirizine (ZYRTEC) 10 MG tablet Take 10 mg by mouth 2 (two) times daily.  . fexofenadine (ALLEGRA) 180 MG tablet Take 180 mg by mouth daily.  Marland Kitchen FLUTICASONE PROPIONATE NA Place into the nose.  . latanoprost (XALATAN) 0.005 % ophthalmic solution   . montelukast (SINGULAIR) 10 MG tablet Take 1 tablet (10 mg total) by mouth at bedtime.   Facility-Administered Encounter Medications as of 08/25/2019  Medication  . omalizumab Arvid Right) injection 300 mg   ALLERGIES: Allergies  Allergen Reactions  . Prednisone   . Statins   . Sulfa Drugs Cross Reactors     VACCINATION STATUS:  There is no  immunization history on file for this patient.  HPI MERLA Johnson is 74 y.o. female who presents today with a medical history as above. she is being seen in follow-up after she was seen consultation for multinodular goiter requested by Abran Richard, MD. -During her last visit, we did not have thyroid ultrasound to review.  She was sent to radiology in [place] to get the study which confirms multinodular goiter with dominant 2.8 cm nodule on the left lobe.  She denies any prior history of thyroid dysfunction.  Her history  started approximately a year ago when she had sinus infection associated with cervical lymphadenopathy.  She recently underwent CT scan of the neck on June 08, 2019 which showed thyroid gland demonstrating multinodular appearance with no obvious nodularity.  -Due to the fact that the original CT showed prominent submandibular glands bilaterally, she underwent ultrasound of neck/soft tissue which showed both submandibular glands to be prominent measuring 3.7 cm on the right and 3.5 cm on the left.  There were scattered lymph nodes around the bones submandibular  regions which measure less than 1 cm short axis measurements. Patient denies any family history of thyroid malignancy, however she reports that her mother was diagnosed with unidentified lymphoma.  Her medical records reveals antithyroid antibodies including TPO and thyroglobulin antibodies elevated in May 2020.  She does not have recent thyroid function tests however August 2020 labs showed euthyroid state.  She denies palpitations, tremors. She denies dysphagia, shortness of breath, nor voice change. She denies any exposure to neck radiation. Her medical history also includes hypertension- not controlled, missed her medication this morning. -She did not have new complaints today.    Review of Systems  Constitutional: no recent weight gain/loss, no fatigue, no subjective hyperthermia, no subjective hypothermia Eyes: no blurry vision, no xerophthalmia ENT: no sore throat, no nodules palpated in throat, no dysphagia/odynophagia, no hoarseness Cardiovascular: no Chest Pain, no Shortness of Breath, no palpitations, no leg swelling Respiratory: no cough, no shortness of breath Gastrointestinal: no Nausea/Vomiting/Diarhhea Musculoskeletal: no muscle/joint aches Skin: no rashes Neurological: no tremors, no numbness, no tingling, no dizziness Psychiatric: no depression, no anxiety  Objective:    Vitals with BMI 08/25/2019 08/07/2019 04/03/2019  Height 5\' 2"  5\' 2"  -  Weight 159 lbs 13 oz 160 lbs 13 oz -  BMI 41.96 22.2 -  Systolic 979 892 (No Data)  Diastolic 54 91 (No Data)  Pulse 76 74 -    BP (!) 126/54   Pulse 76   Ht 5\' 2"  (1.575 m)   Wt 159 lb 12.8 oz (72.5 kg)   BMI 29.23 kg/m   Wt Readings from Last 3 Encounters:  08/25/19 159 lb 12.8 oz (72.5 kg)  08/07/19 160 lb 12.8 oz (72.9 kg)  07/23/18 156 lb 6.4 oz (70.9 kg)    Physical Exam  Constitutional:  Body mass index is 29.23 kg/m.,  not in acute distress, normal state of mind Eyes: PERRLA, EOMI, no exophthalmos ENT: moist  mucous membranes, + gross thyromegaly, + palpable firm left submandibular gland, enlarged soft to firm right submandibular gland, + documented cervical lymphadenopathy bilaterally on ultrasound.   Cardiovascular: normal precordial activity, Regular Rate and Rhythm, no Murmur/Rubs/Gallops Respiratory:  adequate breathing efforts, no gross chest deformity, Clear to auscultation bilaterally Gastrointestinal: abdomen soft, Non -tender, No distension, Bowel Sounds present, no gross organomegaly Musculoskeletal: no gross deformities, strength intact in all four extremities Skin: moist, warm, no rashes Neurological: no tremor with outstretched hands, Deep tendon reflexes  normal in bilateral lower extremities.    Lab Results  Component Value Date   TSH 1.680 08/07/2019   TSH 1.800 09/30/2018   FREET4 1.09 08/07/2019    Results for MORIA, BROPHY (MRN [id number]) as of 08/07/2019 12:51  Ref. Range 07/23/2018 16:01 09/30/2018 10:14  TSH Latest Ref Range: 0.450 - 4.500 uIU/mL  1.800  Thyroxine (T4) Latest Ref Range: 4.5 - 12.0 ug/dL  5.9  Free Thyroxine Index Latest Ref Range: 1.2 - 4.9   1.7  Thyroperoxidase Ab SerPl-aCnc Latest Ref Range: 0 - 34 IU/mL 57 (H)   Thyroglobulin Antibody Latest Ref Range: 0.0 - 0.9 IU/mL 312.9 (H)   T3 Uptake Ratio Latest Ref Range: 24 - 39 %  29    Ultrasound of neck/soft tissue on August 04, 2019: Prominent heterogeneous submandibular glands with prominent bilateral submandibular lymph nodes with normal-appearing morphology.  This could be secondary to inflammation or infection.  No evidence of sialolithiasis or dilated ducts.  Dedicated thyroid ultrasound from [place] on August 24, 2019: Right lobe 5.3 x 2.4 x 1.8 cm, left lobe 5.2 x 2.2 x 2.4 cm.  There is diffuse heterogeneous appearance of the thyroid gland.  There is a questionable dominant left mid gland nodule measuring 2.8 cm.  It appears oval in shape, smoothly marginated, hypoechoic with scattered  vascularity.  No central constipation.  Additional similar-appearing nodules are present bilaterally, lobe smaller.  Impression: Questionable multinodular thyroid with low suspicion for nodules.  Assessment & Plan:   1. Multinodular goiter 2. Hashimoto's thyroiditis 3.  Enlarged bilateral submandibular glands 4.  Hypertension.  -Patient is seen today with results of her imaging studies, all of them are reviewed one more time. -  she has bilaterally enlarged submandibular glands, CT findings of multinodular goiter confirmed by dedicated thyroid ultrasound with multinodular goiter.  Scattered cervical lymphadenopathy.  -She is euthyroid clinically and biochemically. -This patient with Hashimoto's thyroiditis on the background, and family history of lymphoma, work-up to be completed to  Rule out malignancy.   -She will need biopsies of the left submandibular gland, left thyroid lobe nodule, and possibly followed with cervical lymph nodes. -  I did not initiate any new prescriptions today.  She will return in 3 weeks with her biopsy results.  Next approach will depend on  findings. -Her blood pressure is controlled this morning. - she is advised to maintain close follow up with Abran Richard, MD for primary care needs.     - Time spent on this patient care encounter:  20 minutes of which 50% was spent in  counseling and the rest reviewing  her current and  previous labs / studies and medications  doses and developing a plan for long term care. Cassie Johnson  participated in the discussions, expressed understanding, and voiced agreement with the above plans.  All questions were answered to her satisfaction. she is encouraged to contact clinic should she have any questions or concerns prior to her return visit.  Follow up plan: Return in about 3 weeks (around 09/15/2019) for F/U with Biopsy Results.   Glade Lloyd, MD [facility] [address] [place], [place] [address] Phone: [phone]  Fax: [phone]     08/25/2019, 9:49 AM  This note was partially dictated with voice recognition software. Similar sounding words can be transcribed inadequately or may not  be corrected upon review.

## 2019-09-02 ENCOUNTER — Ambulatory Visit
Admission: RE | Admit: 2019-09-02 | Discharge: 2019-09-02 | Disposition: A | Discharge status: Home / Self Care | Payer: Self-pay | Source: Ambulatory Visit | Attending: "Endocrinology | Admitting: "Endocrinology

## 2019-09-02 ENCOUNTER — Encounter (HOSPITAL_COMMUNITY): Payer: Self-pay

## 2019-09-02 ENCOUNTER — Other Ambulatory Visit: Payer: Self-pay | Admitting: "Endocrinology

## 2019-09-02 DIAGNOSIS — E042 Nontoxic multinodular goiter: Secondary | ICD-10-CM

## 2019-09-02 NOTE — Progress Notes (Unsigned)
Cassie Johnson Female, 74 y.o., [birth year] MRN:  [id number] Phone:  [phone] (H) PCP:  Abran Richard, MD Primary Cvg:  Medicare/Medicare Part A And B Next Appt With Radiology (AP-US 1) 09/09/2019 at 11:00 AM  RE: Biopsy Received: Today Message Details  Arne Cleveland, MD  [name]   [place] FNA L mid thyroid nodule   DDH   Previous Messages  ----- Message -----  From: Lenore Cordia  Sent: 09/02/2019 12:18 PM EDT  To: Ir Procedure Requests  Subject: Biopsy                      Procedure Requested: [place] FNA Thyroid, [place] FNA Parotid, [place] Core BX (lymph Node)     Reason for Procedure:Multinodular goiter, Submandibular swelling, Cervical lymphadenopathy    Provider Requesting: Cassandria Anger  Provider Telephone: [phone]    Other Info: films uploaded to PACS

## 2019-09-02 NOTE — Progress Notes (Signed)
Kayla B. Hiser Female, 74 y.o., [birth year] MRN:  [id number] Phone:  [phone] (H) PCP:  Abran Richard, MD Primary Cvg:  Medicare/Medicare Part A And B Next Appt With Radiology (AP-US 1) 09/09/2019 at 11:00 AM  FW: Biopsy Received: Today Message Details  Arne Cleveland, MD  Cassandria Anger, MD Cc: Lenore Cordia I have reviewed the images from her [place] at OSH.  I don't see discreet submandib lesion. WOuld you forward their report to review?  I don't see enlarged lymph nodes. Again, would like to correlate with report.  We will go ahead and set her up for [place] L mid thyroid nodule FNA biopsy for now.   Thanks  Arne Cleveland MD  IR   Previous Messages  ----- Message -----  From: Lenore Cordia  Sent: 09/02/2019 12:18 PM EDT  To: Ir Procedure Requests  Subject: Biopsy                      Procedure Requested: [place] FNA Thyroid, [place] FNA Parotid, [place] Core BX (lymph Node)     Reason for Procedure:Multinodular goiter, Submandibular swelling, Cervical lymphadenopathy    Provider Requesting: Cassandria Anger  Provider Telephone: [phone]    Other Info: films uploaded to PACS

## 2019-09-09 ENCOUNTER — Encounter (HOSPITAL_COMMUNITY): Payer: Self-pay

## 2019-09-09 ENCOUNTER — Other Ambulatory Visit: Payer: Self-pay

## 2019-09-09 ENCOUNTER — Ambulatory Visit (HOSPITAL_COMMUNITY)
Admission: RE | Admit: 2019-09-09 | Discharge: 2019-09-09 | Disposition: A | Discharge status: Home / Self Care | Payer: Medicare Other | Source: Ambulatory Visit | Attending: "Endocrinology | Admitting: "Endocrinology

## 2019-09-09 DIAGNOSIS — E041 Nontoxic single thyroid nodule: Secondary | ICD-10-CM | POA: Diagnosis present

## 2019-09-09 MED ORDER — LIDOCAINE HCL (PF) 2 % IJ SOLN
INTRAMUSCULAR | Status: AC
  Filled 2019-09-09: qty 10

## 2019-09-10 LAB — CYTOLOGY - NON PAP

## 2019-09-28 ENCOUNTER — Other Ambulatory Visit: Payer: Self-pay

## 2019-09-28 ENCOUNTER — Ambulatory Visit (INDEPENDENT_AMBULATORY_CARE_PROVIDER_SITE_OTHER): Payer: Medicare Other | Admitting: "Endocrinology

## 2019-09-28 ENCOUNTER — Encounter: Payer: Self-pay | Admitting: "Endocrinology

## 2019-09-28 VITALS — BP 140/72 | HR 84 | Ht 62.0 in | Wt 161.0 lb

## 2019-09-28 DIAGNOSIS — E042 Nontoxic multinodular goiter: Secondary | ICD-10-CM

## 2019-09-28 NOTE — Progress Notes (Signed)
09/28/2019, 2:37 PM   Endocrinology follow-up note  Subjective:    Patient ID: Cassie Johnson, female    DOB: October 26, 1945, PCP Cassie Richard, MD   Past Medical History:  Diagnosis Date  . High blood pressure   . High cholesterol   . Reflux   . Urticaria    Past Surgical History:  Procedure Laterality Date  . CARPAL TUNNEL RELEASE    . CHOLECYSTECTOMY    . VESICOVAGINAL FISTULA CLOSURE W/ TAH     Social History   Socioeconomic History  . Marital status: Widowed    Spouse name: Not on file  . Number of children: Not on file  . Years of education: Not on file  . Highest education level: Not on file  Occupational History  . Not on file  Tobacco Use  . Smoking status: Former Research scientist (life sciences)  . Smokeless tobacco: Never Used  Vaping Use  . Vaping Use: Never used  Substance and Sexual Activity  . Alcohol use: Yes  . Drug use: No  . Sexual activity: Not on file  Other Topics Concern  . Not on file  Social History Narrative  . Not on file   Social Determinants of Health   Financial Resource Strain:   . Difficulty of Paying Living Expenses:   Food Insecurity:   . Worried About Charity fundraiser in the Last Year:   . Arboriculturist in the Last Year:   Transportation Needs:   . Film/video editor (Medical):   Marland Kitchen Lack of Transportation (Non-Medical):   Physical Activity:   . Days of Exercise per Week:   . Minutes of Exercise per Session:   Stress:   . Feeling of Stress :   Social Connections:   . Frequency of Communication with Friends and Family:   . Frequency of Social Gatherings with Friends and Family:   . Attends Religious Services:   . Active Member of Clubs or Organizations:   . Attends Archivist Meetings:   Marland Kitchen Marital Status:    Family History  Problem Relation Age of Onset  . Allergic rhinitis Sister   . Hypertension Mother   . Heart failure Mother   . Asthma Neg Hx   . Eczema Neg Hx    . Urticaria Neg Hx   . Angioedema Neg Hx   . Atopy Neg Hx   . Immunodeficiency Neg Hx    Outpatient Encounter Medications as of 09/28/2019  Medication Sig  . amLODipine (NORVASC) 10 MG tablet Take 10 mg by mouth daily.  . cetirizine (ZYRTEC) 10 MG tablet Take 10 mg by mouth 2 (two) times daily.  . fexofenadine (ALLEGRA) 180 MG tablet Take 180 mg by mouth daily.  Marland Kitchen FLUTICASONE PROPIONATE NA Place into the nose.  . latanoprost (XALATAN) 0.005 % ophthalmic solution   . montelukast (SINGULAIR) 10 MG tablet Take 1 tablet (10 mg total) by mouth at bedtime.   Facility-Administered Encounter Medications as of 09/28/2019  Medication  . omalizumab Arvid Right) injection 300 mg   ALLERGIES: Allergies  Allergen Reactions  . Prednisone   . Statins   . Sulfa Drugs Cross Reactors     VACCINATION STATUS:  There is no  immunization history on file for this patient.  HPI Cassie Johnson is 74 y.o. female who presents today with a medical history as above. she is being seen in follow-up after she was seen consultation for multinodular goiter requested by Cassie Richard, MD. -See notes from prior visits.   -During her last visit, she was sent for biopsy of thyroid nodule, as well as prominent submandibular glands.  However she was not found to have significant submandibular lesions to biopsy and she only underwent thyroid fine-needle aspiration.  Results are reported to be benign.   She has no new complaints today.   Patient denies any family history of thyroid malignancy, however she reports that her mother was diagnosed with unidentified lymphoma.  Her medical records reveals antithyroid antibodies including TPO and thyroglobulin antibodies elevated in May 2020.  She does not have recent thyroid function tests however August 2020 labs showed euthyroid state.  She denies palpitations, tremors. She denies dysphagia, shortness of breath, nor voice change. She denies any exposure to neck radiation. Her  medical history also includes hypertension- not controlled, missed her medication this morning. -She did not have new complaints today.    Review of Systems  Constitutional: + Slightly fluctuating body weight,  no fatigue, no subjective hyperthermia, no subjective hypothermia Eyes: no blurry vision, no xerophthalmia ENT: no sore throat, no nodules palpated in throat, no dysphagia/odynophagia, no hoarseness Cardiovascular: no Chest Pain, no Shortness of Breath, no palpitations, no leg swelling Respiratory: no cough, no shortness of breath Gastrointestinal: no Nausea/Vomiting/Diarhhea Musculoskeletal: no muscle/joint aches Skin: no rashes Neurological: no tremors, no numbness, no tingling, no dizziness Psychiatric: no depression, no anxiety  Objective:    Vitals with BMI 09/28/2019 09/09/2019 08/25/2019  Height 5\' 2"  - 5\' 2"   Weight 161 lbs - 159 lbs 13 oz  BMI 37.10 - 62.69  Systolic 485 462 703  Diastolic 72 69 54  Pulse 84 76 76    BP 140/72   Pulse 84   Ht 5\' 2"  (1.575 m)   Wt 161 lb (73 kg)   BMI 29.45 kg/m   Wt Readings from Last 3 Encounters:  09/28/19 161 lb (73 kg)  08/25/19 159 lb 12.8 oz (72.5 kg)  08/07/19 160 lb 12.8 oz (72.9 kg)    Physical Exam  Constitutional:  Body mass index is 29.45 kg/m.,  not in acute distress, normal state of mind Eyes: PERRLA, EOMI, no exophthalmos ENT: moist mucous membranes, + gross thyromegaly, + palpable firm left submandibular gland, enlarged soft to firm right submandibular gland, + documented cervical lymphadenopathy bilaterally on ultrasound.   Cardiovascular: normal precordial activity, Regular Rate and Rhythm, no Murmur/Rubs/Gallops Respiratory:  adequate breathing efforts, no gross chest deformity, Clear to auscultation bilaterally Gastrointestinal: abdomen soft, Non -tender, No distension, Bowel Sounds present, no gross organomegaly Musculoskeletal: no gross deformities, strength intact in all four extremities Skin:  moist, warm, no rashes Neurological: no tremor with outstretched hands, Deep tendon reflexes normal in bilateral lower extremities.    Lab Results  Component Value Date   TSH 1.680 08/07/2019   TSH 1.800 09/30/2018   FREET4 1.09 08/07/2019    Results for [name], [name] (MRN [id number]) as of 08/07/2019 12:51  Ref. Range 07/23/2018 16:01 09/30/2018 10:14  TSH Latest Ref Range: 0.450 - 4.500 uIU/mL  1.800  Thyroxine (T4) Latest Ref Range: 4.5 - 12.0 ug/dL  5.9  Free Thyroxine Index Latest Ref Range: 1.2 - 4.9   1.7  Thyroperoxidase Ab SerPl-aCnc Latest Ref Range:  0 - 34 IU/mL 57 (H)   Thyroglobulin Antibody Latest Ref Range: 0.0 - 0.9 IU/mL 312.9 (H)   T3 Uptake Ratio Latest Ref Range: 24 - 39 %  29    Ultrasound of neck/soft tissue on August 04, 2019: Prominent heterogeneous submandibular glands with prominent bilateral submandibular lymph nodes with normal-appearing morphology.  This could be secondary to inflammation or infection.  No evidence of sialolithiasis or dilated ducts.  Dedicated thyroid ultrasound from [place] on August 24, 2019: Right lobe 5.3 x 2.4 x 1.8 cm, left lobe 5.2 x 2.2 x 2.4 cm.  There is diffuse heterogeneous appearance of the thyroid gland.  There is a questionable dominant left mid gland nodule measuring 2.8 cm.  It appears oval in shape, smoothly marginated, hypoechoic with scattered vascularity.  No central constipation.  Additional similar-appearing nodules are present bilaterally, lobe smaller.  Impression: Questionable multinodular thyroid with low suspicion for nodules.   Fine-needle aspiration on September 09, 2019 Clinical History: 2.8 cm LML  Specimen Submitted: A. THYROID GLAND, LEFT, FINE NEEDLE ASPIRATION:  FINAL MICROSCOPIC DIAGNOSIS:  - Consistent with benign follicular nodule (Bethesda category II)   SPECIMEN ADEQUACY:  Satisfactory for evaluation    Assessment & Plan:   1. Multinodular goiter 2. Hashimoto's thyroiditis 3.  Enlarged  bilateral submandibular glands  -Biopsy result of her dominant thyroid nodule was reported to be negative.  During her biopsy, she was not found to have significant submandibular lesions to biopsy per radiology. She did not have significant cervical lymphadenopathy either. -She has euthyroid presentation, would not need any antithyroid intervention for this time.  She will return in 1 year with repeat thyroid function test and for another physical exam.  -She is euthyroid clinically and biochemically. -This patient with Hashimoto's thyroiditis on the background, and family history of lymphoma, work-up to be completed to  Rule out malignancy.   -She will need repeat ultrasound of her thyroid in 1-2 years time.    -  I did not initiate any new prescriptions today.  - she is advised to maintain close follow up with Cassie Richard, MD for primary care needs.     - Time spent on this patient care encounter:  20 minutes of which 50% was spent in  counseling and the rest reviewing  her current and  previous labs / studies and medications  doses and developing a plan for long term care. Cassie Johnson  participated in the discussions, expressed understanding, and voiced agreement with the above plans.  All questions were answered to her satisfaction. she is encouraged to contact clinic should she have any questions or concerns prior to her return visit.   Follow up plan: Return in about 1 year (around 09/27/2020) for F/U with Pre-visit Labs.   Glade Lloyd, MD [facility] [address] [place], [place] [address] Phone: [phone]  Fax: [phone]     09/28/2019, 2:37 PM  This note was partially dictated with voice recognition software. Similar sounding words can be transcribed inadequately or may not  be corrected upon review.

## 2019-11-19 ENCOUNTER — Ambulatory Visit: Payer: Medicare Other | Admitting: Orthopedic Surgery

## 2019-11-20 ENCOUNTER — Ambulatory Visit (INDEPENDENT_AMBULATORY_CARE_PROVIDER_SITE_OTHER): Payer: Medicare Other | Admitting: Orthopedic Surgery

## 2019-11-20 ENCOUNTER — Other Ambulatory Visit: Payer: Self-pay

## 2019-11-20 ENCOUNTER — Ambulatory Visit: Payer: Medicare Other

## 2019-11-20 ENCOUNTER — Encounter: Payer: Self-pay | Admitting: Orthopedic Surgery

## 2019-11-20 VITALS — BP 160/79 | HR 71 | Ht 62.0 in | Wt 161.0 lb

## 2019-11-20 DIAGNOSIS — M67442 Ganglion, left hand: Secondary | ICD-10-CM | POA: Diagnosis not present

## 2019-11-20 DIAGNOSIS — M654 Radial styloid tenosynovitis [de Quervain]: Secondary | ICD-10-CM | POA: Diagnosis not present

## 2019-11-20 DIAGNOSIS — M79641 Pain in right hand: Secondary | ICD-10-CM

## 2019-11-20 DIAGNOSIS — M6740 Ganglion, unspecified site: Secondary | ICD-10-CM

## 2019-11-20 DIAGNOSIS — M25531 Pain in right wrist: Secondary | ICD-10-CM | POA: Diagnosis not present

## 2019-11-20 NOTE — Progress Notes (Addendum)
New Patient Visit  Assessment: Cassie Johnson is a 74 y.o. RHD female with de Quervain's tenosynovitis, right wrist; as well as a ganglion cyst on her left hand, second webspace  Plan: I had extensive discussion with Ms. Brougher in clinic today in regards to her bilateral hands.  Specifically for the left hand, it is most likely a ganglion cyst that has developed in the second interwebspace.  I advised her to continue to monitor this.  If this causes difficulty in the future, we can consider aspiration or possible surgical intervention.  At this time, she is not interested in further treatment.  She will continue to monitor her symptoms.  In regards to her right wrist, her symptoms are most consistent with de Quervain's tenosynovitis.  I outlined the pathology, and presented with multiple options.  At this point in time, she is elected to proceed with a cortisone injection.  Please refer to the note below.  If she gets excellent relief from this, she may not need an thing else in the future.  However, if her symptoms return, she could return to clinic for repeat injection versus further discussion regarding surgical release.  In the meantime, she can continue to take medications as needed, and have advised her to avoid repetitive motions which may aggravate her symptoms. All questions were answered she is amenable this plan.  Procedure note injection Right 1st Dorsal Compartment (DeQuervain's)   Verbal consent was obtained to inject the right wrist  Timeout was completed to confirm the site of injection.  The skin was prepped with alcohol and ethyl chloride was sprayed at the injection site.  A 21-gauge needle was used to inject 40 mg of Depo-Medrol and 1% lidocaine (1 cc) into and around the 1st dorsal compartment  There were no complications. A sterile bandage was applied.   Follow-up: Return if symptoms worsen or fail to improve.   Subjective:  Chief Complaint  Patient presents with  .  Hand Pain    bilateral hand pain, right worse than left, burning sensation.     History of Present Illness: Cassie Johnson is a 74 y.o. female who has been referred to clinic today by Abran Richard, MD for evaluation of bilateral hand pain.  She has been having pain in her right wrist for several months.  She denies a specific onset of her symptoms.  Initially, she tried a brace, but the compression caused more discomfort.  She will take medications as needed, with minimal relief of her symptoms.  Is not painful all the time, but she does notice a sharp pains with specific motions.  She also states that it is followed by burning type sensation.  She is not had any injections to date.  In regards to her left hand, she more recently noticed some swelling, which is noticeable in the dorsum of the hand.  There is been no overlying color changes.  This not as areas not particularly painful.  She just felt that this was some arthritis.  No specific injury in the past.  Review of Systems: No fevers or chills No chest pain No shortness of breath   Medical History:  Past Medical History:  Diagnosis Date  . High blood pressure   . High cholesterol   . Reflux   . Urticaria     Past Surgical History:  Procedure Laterality Date  . CARPAL TUNNEL RELEASE    . CHOLECYSTECTOMY    . VESICOVAGINAL FISTULA CLOSURE W/ TAH  Family History  Problem Relation Age of Onset  . Allergic rhinitis Sister   . Hypertension Mother   . Heart failure Mother   . Asthma Neg Hx   . Eczema Neg Hx   . Urticaria Neg Hx   . Angioedema Neg Hx   . Atopy Neg Hx   . Immunodeficiency Neg Hx    Social History   Tobacco Use  . Smoking status: Former Research scientist (life sciences)  . Smokeless tobacco: Never Used  Vaping Use  . Vaping Use: Never used  Substance Use Topics  . Alcohol use: Yes  . Drug use: No    Allergies  Allergen Reactions  . Prednisone   . Statins   . Sulfa Drugs Cross Reactors     Current Meds  Medication  Sig  . amLODipine (NORVASC) 10 MG tablet Take 10 mg by mouth daily.  . cetirizine (ZYRTEC) 10 MG tablet Take 10 mg by mouth 2 (two) times daily.  . fexofenadine (ALLEGRA) 180 MG tablet Take 180 mg by mouth daily.  Marland Kitchen FLUTICASONE PROPIONATE NA Place into the nose.  . latanoprost (XALATAN) 0.005 % ophthalmic solution    Current Facility-Administered Medications for the 11/20/19 encounter (Office Visit) with Mordecai Rasmussen, MD  Medication  . omalizumab Arvid Right) injection 300 mg    Objective: BP (!) 160/79   Pulse 71   Ht 5\' 2"  (1.575 m)   Wt 161 lb (73 kg)   BMI 29.45 kg/m   Physical Exam: Pleasant female no acute distress. She walks with a nonantalgic gait.  Evaluation of the right hand demonstrates previously healed surgical incisions for trigger finger in the long finger.  There is no deformity about the wrist or the hand.  There is no obvious swelling.  Positive Finkelstein's.  Tenderness to palpation overlying the first dorsal compartment.  Fingers are warm and well perfused.  Evaluation of the left hand demonstrates no obvious deformity.  She does have some well-healed surgical incisions for previous surgeries including carpal tunnel release.  Most noticeable on the dorsum of the hand, there is swelling within the second webspace.  Is at the level of the MCPs.  Minimal tenderness to palpation.  This is also mildly tender to palpation on the volar aspect of the hand.  The swelling is compressible.   IMAGING: I personally ordered and reviewed the following images:  X-rays of the right hand were obtained in clinic today and are essentially normal.  No acute injury is noted.  [place] demonstrates minimal arthritis.  No orders of the defined types were placed in this encounter.     Mordecai Rasmussen, MD  11/20/2019 10:18 AM

## 2019-11-20 NOTE — Patient Instructions (Signed)
Instructions Following Injections  In clinic today, you received an injection in one of your joints (sometimes more than one).  Occasionally, you can have some pain at the injection site, this is normal.  You can place ice at the injection site, or take over-the-counter medications such as Tylenol (acetaminophen) or Advil (ibuprofen).  Please follow all directions listed on the bottle.  If your joint (knee or shoulder) becomes swollen, red or very painful, please contact the clinic for additional assistance.   Two medications were injected, including lidocaine and a steroid (often referred to as cortisone).  Lidocaine is effective almost immediately but wears off quickly.  However, the steroid can take a few days to improve your symptoms.  In some cases, it can make your pain worse for a couple of days.  Do not be concerned if this happens as it is common.  You can apply ice or take some over-the-counter medications as needed.

## 2020-05-29 ENCOUNTER — Encounter (HOSPITAL_COMMUNITY): Payer: Self-pay

## 2020-05-29 ENCOUNTER — Emergency Department (HOSPITAL_COMMUNITY)
Admission: EM | Admit: 2020-05-29 | Discharge: 2020-05-29 | Disposition: A | Discharge status: Home / Self Care | Payer: Medicare Other | Attending: Emergency Medicine | Admitting: Emergency Medicine

## 2020-05-29 ENCOUNTER — Other Ambulatory Visit: Payer: Self-pay

## 2020-05-29 DIAGNOSIS — R591 Generalized enlarged lymph nodes: Secondary | ICD-10-CM

## 2020-05-29 DIAGNOSIS — R599 Enlarged lymph nodes, unspecified: Secondary | ICD-10-CM

## 2020-05-29 DIAGNOSIS — R000 Tachycardia, unspecified: Secondary | ICD-10-CM | POA: Insufficient documentation

## 2020-05-29 DIAGNOSIS — I10 Essential (primary) hypertension: Secondary | ICD-10-CM | POA: Diagnosis not present

## 2020-05-29 DIAGNOSIS — R350 Frequency of micturition: Secondary | ICD-10-CM | POA: Diagnosis present

## 2020-05-29 DIAGNOSIS — Z79899 Other long term (current) drug therapy: Secondary | ICD-10-CM | POA: Diagnosis not present

## 2020-05-29 DIAGNOSIS — Z87891 Personal history of nicotine dependence: Secondary | ICD-10-CM | POA: Insufficient documentation

## 2020-05-29 DIAGNOSIS — R590 Localized enlarged lymph nodes: Secondary | ICD-10-CM | POA: Insufficient documentation

## 2020-05-29 DIAGNOSIS — E119 Type 2 diabetes mellitus without complications: Secondary | ICD-10-CM

## 2020-05-29 LAB — CBC WITH DIFFERENTIAL/PLATELET
Abs Immature Granulocytes: 0.02 10*3/uL (ref 0.00–0.07)
Basophils Absolute: 0 10*3/uL (ref 0.0–0.1)
Basophils Relative: 1 %
Eosinophils Absolute: 0.1 10*3/uL (ref 0.0–0.5)
Eosinophils Relative: 2 %
HCT: 42.5 % (ref 36.0–46.0)
Hemoglobin: 14.4 g/dL (ref 12.0–15.0)
Immature Granulocytes: 0 %
Lymphocytes Relative: 21 %
Lymphs Abs: 1.7 10*3/uL (ref 0.7–4.0)
MCH: 28.2 pg (ref 26.0–34.0)
MCHC: 33.9 g/dL (ref 30.0–36.0)
MCV: 83.3 fL (ref 80.0–100.0)
Monocytes Absolute: 0.7 10*3/uL (ref 0.1–1.0)
Monocytes Relative: 9 %
Neutro Abs: 5.5 10*3/uL (ref 1.7–7.7)
Neutrophils Relative %: 67 %
Platelets: 276 10*3/uL (ref 150–400)
RBC: 5.1 MIL/uL (ref 3.87–5.11)
RDW: 12.1 % (ref 11.5–15.5)
WBC: 8.1 10*3/uL (ref 4.0–10.5)
nRBC: 0 % (ref 0.0–0.2)

## 2020-05-29 LAB — COMPREHENSIVE METABOLIC PANEL
ALT: 34 U/L (ref 0–44)
AST: 29 U/L (ref 15–41)
Albumin: 4 g/dL (ref 3.5–5.0)
Alkaline Phosphatase: 89 U/L (ref 38–126)
Anion gap: 15 (ref 5–15)
BUN: 23 mg/dL (ref 8–23)
CO2: 25 mmol/L (ref 22–32)
Calcium: 9 mg/dL (ref 8.9–10.3)
Chloride: 89 mmol/L — ABNORMAL LOW (ref 98–111)
Creatinine, Ser: 1.39 mg/dL — ABNORMAL HIGH (ref 0.44–1.00)
GFR, Estimated: 40 mL/min — ABNORMAL LOW (ref >60)
Glucose, Bld: 481 mg/dL — ABNORMAL HIGH (ref 70–99)
Potassium: 3.1 mmol/L — ABNORMAL LOW (ref 3.5–5.1)
Sodium: 129 mmol/L — ABNORMAL LOW (ref 135–145)
Total Bilirubin: 1.6 mg/dL — ABNORMAL HIGH (ref 0.3–1.2)
Total Protein: 8.6 g/dL — ABNORMAL HIGH (ref 6.5–8.1)

## 2020-05-29 LAB — BASIC METABOLIC PANEL
Anion gap: 11 (ref 5–15)
BUN: 20 mg/dL (ref 8–23)
CO2: 27 mmol/L (ref 22–32)
Calcium: 8.9 mg/dL (ref 8.9–10.3)
Chloride: 96 mmol/L — ABNORMAL LOW (ref 98–111)
Creatinine, Ser: 1.27 mg/dL — ABNORMAL HIGH (ref 0.44–1.00)
GFR, Estimated: 45 mL/min — ABNORMAL LOW (ref >60)
Glucose, Bld: 293 mg/dL — ABNORMAL HIGH (ref 70–99)
Potassium: 3.1 mmol/L — ABNORMAL LOW (ref 3.5–5.1)
Sodium: 134 mmol/L — ABNORMAL LOW (ref 135–145)

## 2020-05-29 LAB — URINALYSIS, ROUTINE W REFLEX MICROSCOPIC
Bacteria, UA: NONE SEEN
Bilirubin Urine: NEGATIVE
Glucose, UA: >=500 mg/dL — AB
Hgb urine dipstick: NEGATIVE
Ketones, ur: 20 mg/dL — AB
Nitrite: NEGATIVE
Protein, ur: NEGATIVE mg/dL
Specific Gravity, Urine: 1.023 (ref 1.005–1.030)
pH: 5 (ref 5.0–8.0)

## 2020-05-29 LAB — HEMOGLOBIN A1C
Hgb A1c MFr Bld: 10.7 % — ABNORMAL HIGH (ref 4.8–5.6)
Mean Plasma Glucose: 260.39 mg/dL

## 2020-05-29 LAB — CBG MONITORING, ED
Glucose-Capillary: 507 mg/dL (ref 70–99)
Glucose-Capillary: 326 mg/dL — ABNORMAL HIGH (ref 70–99)
Glucose-Capillary: 166 mg/dL — ABNORMAL HIGH (ref 70–99)
Glucose-Capillary: 192 mg/dL — ABNORMAL HIGH (ref 70–99)

## 2020-05-29 LAB — BETA-HYDROXYBUTYRIC ACID: Beta-Hydroxybutyric Acid: 1.67 mmol/L — ABNORMAL HIGH (ref 0.05–0.27)

## 2020-05-29 MED ORDER — METFORMIN HCL 500 MG PO TABS
500.0000 mg | ORAL_TABLET | Freq: Once | ORAL | Status: AC
  Administered 2020-05-29: 500 mg via ORAL
  Filled 2020-05-29: qty 1

## 2020-05-29 MED ORDER — METFORMIN HCL 500 MG PO TABS
500.0000 mg | ORAL_TABLET | Freq: Two times a day (BID) | ORAL | 0 refills | Status: DC
Start: 2020-05-29 — End: 2021-01-31

## 2020-05-29 MED ORDER — SODIUM CHLORIDE 0.9 % IV BOLUS
1000.0000 mL | Freq: Once | INTRAVENOUS | Status: AC
  Administered 2020-05-29: 1000 mL via INTRAVENOUS

## 2020-05-29 MED ORDER — INSULIN ASPART 100 UNIT/ML ~~LOC~~ SOLN
10.0000 [IU] | Freq: Once | SUBCUTANEOUS | Status: AC
  Administered 2020-05-29: 10 [IU] via SUBCUTANEOUS
  Filled 2020-05-29: qty 1

## 2020-05-29 NOTE — ED Provider Notes (Signed)
Patient's glucose went down to 166. Thus a repeat was obtained an hour later and is now 192. Stable for d/c   Sherwood Gambler, MD 05/29/20 2000

## 2020-05-29 NOTE — ED Provider Notes (Signed)
[facility] EMERGENCY DEPARTMENT Provider Note   CSN: [id number] Arrival date & time: 05/29/20  1040     History Chief Complaint  Patient presents with  . Hyperglycemia    JANYIAH SILVERI is a 75 y.o. female.  HPI   This patient is a 75 year old female, she has a history of high blood pressure and high cholesterol, she complains today of having some increasing urinary frequency as well as some increasing thirst and about an 8 pound weight loss over the last couple of months.  She has had a feeling of lightheadedness and some intermittent blurry vision as well.  She last saw her physician in December, she was told at that time that her blood sugar was borderline and she had a hemoglobin A1c of about 6.1 the last time it was checked.  She is unsure of what her blood work showed in December.  She reports that she has never been treated for diabetes, she was in her usual state of health until things started to get a little worse over the last couple of weeks.  She denies fevers, chills, nausea, vomiting, abdominal pain chest pain coughing or shortness of breath.  She did have diarrhea a couple of times a few days ago but that has spontaneously improved.  She has no blood in her stool.  She has no swelling of her legs no rashes, she has no dysuria or hematuria.  She has not seen her family doctor for this complaint but came this morning because her blood sugar at home was over 400 last night and still over 400 this morning.  This was after only eating a salad yesterday and then some popcorn.  Past Medical History:  Diagnosis Date  . High blood pressure   . High cholesterol   . Reflux   . Urticaria     Patient Active Problem List   Diagnosis Date Noted  . Multinodular goiter 08/07/2019  . Hashimoto's thyroiditis 08/07/2019  . Submandibular swelling 08/07/2019  . Essential hypertension, benign 08/07/2019  . Allergic urticaria 07/23/2018  . Seasonal and perennial allergic rhinitis 07/23/2018   . Spinal stenosis of lumbar region 06/21/2011  . Lumbar herniated disc 06/06/2011  . SCIATICA 08/01/2009  . LUMBAR SPRAIN AND STRAIN 08/01/2009  . HIGH BLOOD PRESSURE 08/01/2009    Past Surgical History:  Procedure Laterality Date  . ABDOMINAL HYSTERECTOMY    . CARPAL TUNNEL RELEASE    . CHOLECYSTECTOMY    . VESICOVAGINAL FISTULA CLOSURE W/ TAH       OB History   No obstetric history on file.     Family History  Problem Relation Age of Onset  . Allergic rhinitis Sister   . Hypertension Mother   . Heart failure Mother   . Asthma Neg Hx   . Eczema Neg Hx   . Urticaria Neg Hx   . Angioedema Neg Hx   . Atopy Neg Hx   . Immunodeficiency Neg Hx     Social History   Tobacco Use  . Smoking status: Former Research scientist (life sciences)  . Smokeless tobacco: Never Used  Vaping Use  . Vaping Use: Never used  Substance Use Topics  . Alcohol use: Yes  . Drug use: No    Home Medications Prior to Admission medications   Medication Sig Start Date End Date Taking? Authorizing Provider  amLODipine (NORVASC) 10 MG tablet Take 10 mg by mouth daily.   Yes [provider]  cetirizine (ZYRTEC) 10 MG tablet Take 10 mg by  mouth 2 (two) times daily.   Yes [provider]  hydrochlorothiazide (HYDRODIURIL) 25 MG tablet Take 1 tablet by mouth daily. 04/19/20  Yes [provider]  latanoprost (XALATAN) 0.005 % ophthalmic solution Place 1 drop into both eyes daily at 12 noon. 04/14/18  Yes [provider]  FLUTICASONE PROPIONATE NA Place into the nose.    [provider]    Allergies    Prednisone, Statins, and Sulfa drugs cross reactors  Review of Systems   Review of Systems  All other systems reviewed and are negative.   Physical Exam Updated Vital Signs BP 136/64   Pulse 80   Temp 98.6 F (37 C) (Oral)   Resp 18   Ht 1.575 m (5\' 2" )   Wt 67.1 kg   SpO2 95%   BMI 27.07 kg/m   Physical Exam Vitals and nursing note reviewed.  Constitutional:       General: She is not in acute distress.    Appearance: She is well-developed.  HENT:     Head: Normocephalic and atraumatic.     Mouth/Throat:     Pharynx: No oropharyngeal exudate.     Comments: Normal-appearing mucous membranes Eyes:     General: No scleral icterus.       Right eye: No discharge.        Left eye: No discharge.     Conjunctiva/sclera: Conjunctivae normal.     Pupils: Pupils are equal, round, and reactive to light.  Neck:     Thyroid: No thyromegaly.     Vascular: No JVD.     Comments: There appears to be a prominent nontender mobile gland in the right neck either cervical anterior chain or submandibular region.  Smaller but still present lymph node in the left area on the other side Cardiovascular:     Rate and Rhythm: Regular rhythm. Tachycardia present.     Heart sounds: Normal heart sounds. No murmur heard. No friction rub. No gallop.      Comments: Rate of 102 bpm Pulmonary:     Effort: Pulmonary effort is normal. No respiratory distress.     Breath sounds: Normal breath sounds. No wheezing or rales.  Abdominal:     General: Bowel sounds are normal. There is no distension.     Palpations: Abdomen is soft. There is no mass.     Tenderness: There is no abdominal tenderness.  Musculoskeletal:        General: No tenderness. Normal range of motion.     Cervical back: Normal range of motion and neck supple.  Lymphadenopathy:     Cervical: Cervical adenopathy present.  Skin:    General: Skin is warm and dry.     Findings: No erythema or rash.  Neurological:     Mental Status: She is alert.     Coordination: Coordination normal.  Psychiatric:        Behavior: Behavior normal.     ED Results / Procedures / Treatments   Labs (all labs ordered are listed, but only abnormal results are displayed) Labs Reviewed  COMPREHENSIVE METABOLIC PANEL - Abnormal; Notable for the following components:      Result Value   Sodium 129 (*)    Potassium 3.1 (*)     Chloride 89 (*)    Glucose, Bld 481 (*)    Creatinine, Ser 1.39 (*)    Total Protein 8.6 (*)    Total Bilirubin 1.6 (*)    GFR, Estimated 40 (*)  All other components within normal limits  URINALYSIS, ROUTINE W REFLEX MICROSCOPIC - Abnormal; Notable for the following components:   APPearance HAZY (*)    Glucose, UA >=500 (*)    Ketones, ur 20 (*)    Leukocytes,Ua SMALL (*)    All other components within normal limits  BETA-HYDROXYBUTYRIC ACID - Abnormal; Notable for the following components:   Beta-Hydroxybutyric Acid 1.67 (*)    All other components within normal limits  BASIC METABOLIC PANEL - Abnormal; Notable for the following components:   Sodium 134 (*)    Potassium 3.1 (*)    Chloride 96 (*)    Glucose, Bld 293 (*)    Creatinine, Ser 1.27 (*)    GFR, Estimated 45 (*)    All other components within normal limits  CBG MONITORING, ED - Abnormal; Notable for the following components:   Glucose-Capillary 507 (*)    All other components within normal limits  CBG MONITORING, ED - Abnormal; Notable for the following components:   Glucose-Capillary 326 (*)    All other components within normal limits  CBC WITH DIFFERENTIAL/PLATELET  HEMOGLOBIN A1C    EKG None  Radiology No results found.  Procedures Procedures   Medications Ordered in ED Medications  sodium chloride 0.9 % bolus 1,000 mL (0 mLs Intravenous Stopped 05/29/20 1243)  insulin aspart (novoLOG) injection 10 Units (10 Units Subcutaneous Given 05/29/20 1238)  sodium chloride 0.9 % bolus 1,000 mL (1,000 mLs Intravenous New Bag/Given 05/29/20 1520)    ED Course  I have reviewed the triage vital signs and the nursing notes.  Pertinent labs & imaging results that were available during my care of the patient were reviewed by me and considered in my medical decision making (see chart for details).  Clinical Course as of 05/29/20 1600  Sun May 29, 2020  1214 Small amount of ketones present in the urine but no  significant anion gap, no urinary infection, normal blood counts, likely would benefit from potassium sodium normal saline and starting on diabetic medications. [BM]    Clinical Course User Index [BM] Noemi Chapel, MD   MDM Rules/Calculators/A&P                          The patient reports to me that she thinks that she has a high blood sugar today, this all makes sense with the other symptoms that this could be new onset diabetes.  We will confirm with lab work.  Additionally the patient will need to be treated with some IV fluids and possibly insulin.  Her vital signs are reassuring, she does have some hypertension and a mild tachycardia but both of these should improve.  She is already on medications for her hypertension, this includes amlodipine.  She may need to be started on some antidiabetic medications.  I have also talked to the patient extensively about this lymph node in her neck which she states has been present for a couple of years, she was referred for thyroid evaluation and was told that nothing was wrong.  At this time she may need further evaluation for source of swollen lymph nodes.  It is not clear exactly what is been done in the past.  She requests other referral locally for specialist that she is not entirely sure what to do next.  The patient's recheck on her metabolic panel shows a glucose of 293, a CO2 of 27 and an anion gap of 11.  She is  well-appearing with normal vital signs and understands the plan for Metformin twice a day and follow-up, the patient has had an opportunity to express any questions, she has been informed of the treatment plan and is in total agreement.  She has a local family doctor at [facility].  She is receiving her second liter of IV fluids and the plan is for discharge as long as she is not hypoglycemic.  Stable for discharge at this time  New onset diabetes  A1c pending  Final Clinical Impression(s) / ED Diagnoses Final diagnoses:   Diabetes mellitus, new onset ([place])  Swollen gland  Lymphadenopathy of head and neck      Noemi Chapel, MD 05/29/20 1601

## 2020-05-29 NOTE — Discharge Instructions (Addendum)
Please follow a strict diabetic diet, please see the attached reading instructions regarding diabetes  Please take Metformin 500 mg twice a day in addition to your other medications.  Please continue to exercise as you have been, if you check your blood sugar every day and find that the numbers rising you may follow-up with your doctor or return to the emergency department if you are feeling poorly with increasing shortness of breath weakness numbness vomiting or any other severe or worsening symptoms.

## 2020-05-29 NOTE — ED Triage Notes (Signed)
Pt to er, pt states that Friday she checked her sugar and her monitor registered high, states that she is not a dx diabetic, states that she has tried to eat healthy all weekend and she checked her sugar today and it was 421, states that she has been urinating a lot and having some blurry vision.

## 2020-09-23 LAB — TSH: TSH: 1.43 u[IU]/mL (ref 0.450–4.500)

## 2020-09-23 LAB — T3, FREE: T3, Free: 2.9 pg/mL (ref 2.0–4.4)

## 2020-09-23 LAB — T4, FREE: Free T4: 1.29 ng/dL (ref 0.82–1.77)

## 2020-09-27 ENCOUNTER — Ambulatory Visit: Payer: Medicare Other | Admitting: "Endocrinology

## 2020-09-29 ENCOUNTER — Other Ambulatory Visit: Payer: Self-pay

## 2020-09-29 ENCOUNTER — Ambulatory Visit (INDEPENDENT_AMBULATORY_CARE_PROVIDER_SITE_OTHER): Payer: Medicare Other | Admitting: "Endocrinology

## 2020-09-29 ENCOUNTER — Encounter: Payer: Self-pay | Admitting: "Endocrinology

## 2020-09-29 VITALS — BP 130/58 | HR 72 | Ht 62.0 in | Wt 144.2 lb

## 2020-09-29 DIAGNOSIS — E1165 Type 2 diabetes mellitus with hyperglycemia: Secondary | ICD-10-CM | POA: Insufficient documentation

## 2020-09-29 DIAGNOSIS — E042 Nontoxic multinodular goiter: Secondary | ICD-10-CM

## 2020-09-29 LAB — POCT GLYCOSYLATED HEMOGLOBIN (HGB A1C): HbA1c, POC (controlled diabetic range): 5.6 % (ref 0.0–7.0)

## 2020-09-29 NOTE — Progress Notes (Signed)
09/29/2020, 4:55 PM   Endocrinology follow-up note  Subjective:    Patient ID: Cassie Johnson, female    DOB: January 31, 1946, PCP Abran Richard, MD   Past Medical History:  Diagnosis Date   High blood pressure    High cholesterol    Reflux    Urticaria    Past Surgical History:  Procedure Laterality Date   ABDOMINAL HYSTERECTOMY     CARPAL TUNNEL RELEASE     CHOLECYSTECTOMY     VESICOVAGINAL FISTULA CLOSURE W/ TAH     Social History   Socioeconomic History   Marital status: Widowed    Spouse name: Not on file   Number of children: Not on file   Years of education: Not on file   Highest education level: Not on file  Occupational History   Not on file  Tobacco Use   Smoking status: Former   Smokeless tobacco: Never  Vaping Use   Vaping Use: Never used  Substance and Sexual Activity   Alcohol use: Yes   Drug use: No   Sexual activity: Not on file  Other Topics Concern   Not on file  Social History Narrative   Not on file   Social Determinants of Health   Financial Resource Strain: Not on file  Food Insecurity: Not on file  Transportation Needs: Not on file  Physical Activity: Not on file  Stress: Not on file  Social Connections: Not on file   Family History  Problem Relation Age of Onset   Allergic rhinitis Sister    Hypertension Mother    Heart failure Mother    Asthma Neg Hx    Eczema Neg Hx    Urticaria Neg Hx    Angioedema Neg Hx    Atopy Neg Hx    Immunodeficiency Neg Hx    Outpatient Encounter Medications as of 09/29/2020  Medication Sig   amLODipine (NORVASC) 10 MG tablet Take 10 mg by mouth daily.   cetirizine (ZYRTEC) 10 MG tablet Take 10 mg by mouth 2 (two) times daily.   FLUTICASONE PROPIONATE NA Place into the nose.   hydrochlorothiazide (HYDRODIURIL) 25 MG tablet Take 1 tablet by mouth daily.   latanoprost (XALATAN) 0.005 % ophthalmic solution Place 1 drop into both eyes daily at 12  noon.   metFORMIN (GLUCOPHAGE) 500 MG tablet Take 1 tablet (500 mg total) by mouth 2 (two) times daily with a meal.   Facility-Administered Encounter Medications as of 09/29/2020  Medication   omalizumab Arvid Right) injection 300 mg   ALLERGIES: Allergies  Allergen Reactions   Prednisone    Statins    Sulfa Drugs Cross Reactors     VACCINATION STATUS:  There is no immunization history on file for this patient.  HPI Cassie Johnson is 75 y.o. female who presents today with a medical history as above. she is being seen in follow-up after she was seen consultation for multinodular goiter requested by Abran Richard, MD. -See notes from prior visits.   -During her last visit, she was sent for biopsy of thyroid nodule, as well as prominent submandibular glands.  However she was not found to have significant submandibular lesions to biopsy and she only underwent thyroid fine-needle  aspiration.  Results are reported to be benign.   She did not have any further surveillance thyroid/neck ultrasound since last visit.  She believes the right submandibular gland has increased in size.  Patient denies any family history of thyroid malignancy, however she reports that her mother was diagnosed with unidentified lymphoma.  Her medical records reveals antithyroid antibodies including TPO and thyroglobulin antibodies elevated in May 2020.  She does not have recent thyroid function tests however August 2020 labs showed euthyroid state.  She denies palpitations, tremors. She denies dysphagia, shortness of breath, nor voice change. She denies any exposure to neck radiation. Her medical history also includes hypertension- not controlled, missed her medication this morning. -She was diagnosed with type 2 diabetes in the interim.  She is currently on metformin 500 mg p.o. twice daily.    Review of Systems    Objective:    Vitals with BMI 09/29/2020 05/29/2020 05/29/2020  Height '5\' 2"'$  - -  Weight 144 lbs 3 oz  - -  BMI 123XX123 - -  Systolic [id number]  Diastolic 58 61 54  Pulse 72 71 82    BP (!) 130/58   Pulse 72   Ht '5\' 2"'$  (1.575 m)   Wt 144 lb 3.2 oz (65.4 kg)   BMI 26.37 kg/m   Wt Readings from Last 3 Encounters:  09/29/20 144 lb 3.2 oz (65.4 kg)  05/29/20 148 lb (67.1 kg)  11/20/19 161 lb (73 kg)    Physical Exam  Constitutional:  Body mass index is 26.37 kg/m.,  not in acute distress, normal state of mind Eyes: PERRLA, EOMI, no exophthalmos ENT: moist mucous membranes, + gross thyromegaly, + palpable firm right submandibular gland, enlarged soft to firm right submandibular gland, + documented cervical lymphadenopathy bilaterally on ultrasound.      Lab Results  Component Value Date   TSH 1.430 09/22/2020   TSH 1.680 08/07/2019   TSH 1.800 09/30/2018   FREET4 1.29 09/22/2020   FREET4 1.09 08/07/2019    Results for TIMEA, KOZISEK (MRN [id number]) as of 08/07/2019 12:51  Ref. Range 07/23/2018 16:01 09/30/2018 10:14  TSH Latest Ref Range: 0.450 - 4.500 uIU/mL  1.800  Thyroxine (T4) Latest Ref Range: 4.5 - 12.0 ug/dL  5.9  Free Thyroxine Index Latest Ref Range: 1.2 - 4.9   1.7  Thyroperoxidase Ab SerPl-aCnc Latest Ref Range: 0 - 34 IU/mL 57 (H)   Thyroglobulin Antibody Latest Ref Range: 0.0 - 0.9 IU/mL 312.9 (H)   T3 Uptake Ratio Latest Ref Range: 24 - 39 %  29    Ultrasound of neck/soft tissue on August 04, 2019: Prominent heterogeneous submandibular glands with prominent bilateral submandibular lymph nodes with normal-appearing morphology.  This could be secondary to inflammation or infection.  No evidence of sialolithiasis or dilated ducts.  Dedicated thyroid ultrasound from [place] on August 24, 2019: Right lobe 5.3 x 2.4 x 1.8 cm, left lobe 5.2 x 2.2 x 2.4 cm.  There is diffuse heterogeneous appearance of the thyroid gland.  There is a questionable dominant left mid gland nodule measuring 2.8 cm.  It appears oval in shape, smoothly marginated, hypoechoic with  scattered vascularity.  No central constipation.  Additional similar-appearing nodules are present bilaterally, lobe smaller.  Impression: Questionable multinodular thyroid with low suspicion for nodules.   Fine-needle aspiration on September 09, 2019 Clinical History: 2.8 cm LML  Specimen Submitted:  A. THYROID GLAND, LEFT, FINE NEEDLE ASPIRATION:  FINAL MICROSCOPIC DIAGNOSIS:  - Consistent with  benign follicular nodule (Bethesda category II)   SPECIMEN ADEQUACY:  Satisfactory for evaluation    Assessment & Plan:   1. Multinodular goiter 2. Hashimoto's thyroiditis 3.  Enlarged bilateral submandibular glands 4.  Type 2 diabetes  -Biopsy result of her dominant thyroid nodule was reported to be negative.  During her biopsy, she was not found to have significant submandibular lesions to biopsy per radiology. She did not have significant cervical lymphadenopathy either. -She has euthyroid presentation, would not need any antithyroid intervention for this time.  Given her palpable right submandibular gland, she will be sent for surveillance thyroid/neck ultrasound.   -She is euthyroid clinically and biochemically. -This patient with Hashimoto's thyroiditis on the background, and family history of lymphoma, work-up to be completed to  Rule out malignancy.    Regarding her interval diagnosed with type 2 diabetes: Her point-of-care A1c is 5.6%.  She has been significantly changing her lifestyle.  She is advised to continue her metformin 500 mg p.o. daily after breakfast.  -  I did not initiate any new prescriptions today.  - she is advised to maintain close follow up with Abran Richard, MD for primary care needs.   I spent 25 minutes in the care of the patient today including review of labs from Thyroid Function, CMP, and other relevant labs ; imaging/biopsy records (current and previous including abstractions from other facilities); face-to-face time discussing  her lab results and symptoms,  medications doses, her options of short and long term treatment based on the latest standards of care / guidelines;   and documenting the encounter.  Cassie Johnson  participated in the discussions, expressed understanding, and voiced agreement with the above plans.  All questions were answered to her satisfaction. she is encouraged to contact clinic should she have any questions or concerns prior to her return visit.   Follow up plan: Return in about 4 months (around 01/29/2021) for Thyroid / Neck Ultrasound, A1c -[place].   Glade Lloyd, MD [facility] [address] [place], [place] [address] Phone: [phone]  Fax: [phone]     09/29/2020, 4:55 PM  This note was partially dictated with voice recognition software. Similar sounding words can be transcribed inadequately or may not  be corrected upon review.

## 2020-09-29 NOTE — Patient Instructions (Signed)

## 2020-12-01 ENCOUNTER — Ambulatory Visit (HOSPITAL_COMMUNITY)
Admission: RE | Admit: 2020-12-01 | Discharge: 2020-12-01 | Disposition: A | Discharge status: Home / Self Care | Payer: Medicare Other | Source: Ambulatory Visit | Attending: "Endocrinology | Admitting: "Endocrinology

## 2020-12-01 ENCOUNTER — Other Ambulatory Visit: Payer: Self-pay

## 2020-12-01 DIAGNOSIS — E042 Nontoxic multinodular goiter: Secondary | ICD-10-CM | POA: Insufficient documentation

## 2020-12-08 ENCOUNTER — Ambulatory Visit (HOSPITAL_COMMUNITY): Payer: Medicare Other

## 2021-01-24 LAB — HEMOGLOBIN A1C: Hemoglobin A1C: 5.7

## 2021-01-31 ENCOUNTER — Encounter: Payer: Self-pay | Admitting: "Endocrinology

## 2021-01-31 ENCOUNTER — Other Ambulatory Visit: Payer: Self-pay

## 2021-01-31 ENCOUNTER — Ambulatory Visit (INDEPENDENT_AMBULATORY_CARE_PROVIDER_SITE_OTHER): Payer: Medicare Other | Admitting: "Endocrinology

## 2021-01-31 VITALS — BP 126/54 | HR 76 | Ht 62.0 in | Wt 142.8 lb

## 2021-01-31 DIAGNOSIS — E119 Type 2 diabetes mellitus without complications: Secondary | ICD-10-CM | POA: Diagnosis not present

## 2021-01-31 DIAGNOSIS — E042 Nontoxic multinodular goiter: Secondary | ICD-10-CM | POA: Diagnosis not present

## 2021-01-31 MED ORDER — METFORMIN HCL 500 MG PO TABS: 500.0000 mg | ORAL_TABLET | Freq: Every day | ORAL | 0 refills | Status: DC

## 2021-01-31 NOTE — Progress Notes (Signed)
01/31/2021, 4:53 PM   Endocrinology follow-up note  Subjective:    Patient ID: Cassie Johnson, female    DOB: 14-Jul-1945, PCP Abran Richard, MD   Past Medical History:  Diagnosis Date   High blood pressure    High cholesterol    Reflux    Urticaria    Past Surgical History:  Procedure Laterality Date   ABDOMINAL HYSTERECTOMY     CARPAL TUNNEL RELEASE     CHOLECYSTECTOMY     VESICOVAGINAL FISTULA CLOSURE W/ TAH     Social History   Socioeconomic History   Marital status: Widowed    Spouse name: Not on file   Number of children: Not on file   Years of education: Not on file   Highest education level: Not on file  Occupational History   Not on file  Tobacco Use   Smoking status: Former   Smokeless tobacco: Never  Vaping Use   Vaping Use: Never used  Substance and Sexual Activity   Alcohol use: Yes   Drug use: No   Sexual activity: Not on file  Other Topics Concern   Not on file  Social History Narrative   Not on file   Social Determinants of Health   Financial Resource Strain: Not on file  Food Insecurity: Not on file  Transportation Needs: Not on file  Physical Activity: Not on file  Stress: Not on file  Social Connections: Not on file   Family History  Problem Relation Age of Onset   Allergic rhinitis Sister    Hypertension Mother    Heart failure Mother    Asthma Neg Hx    Eczema Neg Hx    Urticaria Neg Hx    Angioedema Neg Hx    Atopy Neg Hx    Immunodeficiency Neg Hx    Outpatient Encounter Medications as of 01/31/2021  Medication Sig   cetirizine (ZYRTEC) 10 MG tablet Take 10 mg by mouth 2 (two) times daily.   ezetimibe (ZETIA) 10 MG tablet Take 10 mg by mouth daily.   FLUTICASONE PROPIONATE NA Place into the nose.   hydrochlorothiazide (HYDRODIURIL) 25 MG tablet Take 1 tablet by mouth daily.   latanoprost (XALATAN) 0.005 % ophthalmic solution Place 1 drop into both eyes daily at 12  noon.   lisinopril (ZESTRIL) 10 MG tablet Take 5 mg by mouth daily with breakfast.   metFORMIN (GLUCOPHAGE) 500 MG tablet Take 1 tablet (500 mg total) by mouth daily with breakfast.   [DISCONTINUED] amLODipine (NORVASC) 10 MG tablet Take 10 mg by mouth daily.   [DISCONTINUED] metFORMIN (GLUCOPHAGE) 500 MG tablet Take 1 tablet (500 mg total) by mouth 2 (two) times daily with a meal. (Patient taking differently: Take 500 mg by mouth daily with breakfast.)   Facility-Administered Encounter Medications as of 01/31/2021  Medication   omalizumab Arvid Right) injection 300 mg   ALLERGIES: Allergies  Allergen Reactions   Prednisone    Statins    Sulfa Drugs Cross Reactors     VACCINATION STATUS:  There is no immunization history on file for this patient.  HPI Cassie Johnson is 75 y.o. female who presents today with a medical history as above. she is being seen  in follow-up after she was seen consultation for multinodular goiter requested by Abran Richard, MD. -See notes from prior visits.   -During her previous visits she was sent for a biopsy of left-sided thyroid nodule with benign findings.  She is returning for follow-up with repeat thyroid ultrasound and thyroid function test.  She has no new complaints.     Patient denies any family history of thyroid malignancy, however she reports that her mother was diagnosed with unidentified lymphoma.  Her medical records reveals antithyroid antibodies including TPO and thyroglobulin antibodies elevated in May 2020.  She does not have recent thyroid function tests however August 2020 labs showed euthyroid state.  She denies palpitations, tremors. She denies dysphagia, shortness of breath, nor voice change. She denies any exposure to neck radiation. -She was diagnosed with type 2 diabetes in the interim.  She is currently on metformin 500 mg p.o. once a day with recent A1c of 5.7%.  She is on amlodipine, hydrochlorothiazide, and lisinopril for blood  pressure management.  She complains of dropping blood pressure at home and she registered 126/54 in the clinic today.    Review of Systems    Objective:    Vitals with BMI 01/31/2021 09/29/2020 05/29/2020  Height 5\' 2"  5\' 2"  -  Weight 142 lbs 13 oz 144 lbs 3 oz -  BMI 22.97 98.92 -  Systolic 119 417 408  Diastolic 54 58 61  Pulse 76 72 71    BP (!) 126/54   Pulse 76   Ht 5\' 2"  (1.575 m)   Wt 142 lb 12.8 oz (64.8 kg)   BMI 26.12 kg/m   Wt Readings from Last 3 Encounters:  01/31/21 142 lb 12.8 oz (64.8 kg)  09/29/20 144 lb 3.2 oz (65.4 kg)  05/29/20 148 lb (67.1 kg)    Physical Exam  Constitutional:  Body mass index is 26.12 kg/m.,  not in acute distress, normal state of mind Eyes: PERRLA, EOMI, no exophthalmos ENT: moist mucous membranes, + gross thyromegaly, + palpable firm right submandibular gland, enlarged soft to firm right submandibular gland, + documented cervical lymphadenopathy bilaterally on ultrasound.      Lab Results  Component Value Date   TSH 1.430 09/22/2020   TSH 1.680 08/07/2019   TSH 1.800 09/30/2018   FREET4 1.29 09/22/2020   FREET4 1.09 08/07/2019    Results for KAILE, BIXLER (MRN [id number]) as of 08/07/2019 12:51  Ref. Range 07/23/2018 16:01 09/30/2018 10:14  TSH Latest Ref Range: 0.450 - 4.500 uIU/mL  1.800  Thyroxine (T4) Latest Ref Range: 4.5 - 12.0 ug/dL  5.9  Free Thyroxine Index Latest Ref Range: 1.2 - 4.9   1.7  Thyroperoxidase Ab SerPl-aCnc Latest Ref Range: 0 - 34 IU/mL 57 (H)   Thyroglobulin Antibody Latest Ref Range: 0.0 - 0.9 IU/mL 312.9 (H)   T3 Uptake Ratio Latest Ref Range: 24 - 39 %  29    Ultrasound of neck/soft tissue on August 04, 2019: Prominent heterogeneous submandibular glands with prominent bilateral submandibular lymph nodes with normal-appearing morphology.  This could be secondary to inflammation or infection.  No evidence of sialolithiasis or dilated ducts.  Dedicated thyroid ultrasound from [place] on August 24, 2019: Right lobe 5.3 x 2.4 x 1.8 cm, left lobe 5.2 x 2.2 x 2.4 cm.  There is diffuse heterogeneous appearance of the thyroid gland.  There is a questionable dominant left mid gland nodule measuring 2.8 cm.  It appears oval in shape, smoothly marginated, hypoechoic with  scattered vascularity.  No central constipation.  Additional similar-appearing nodules are present bilaterally, lobe smaller.  Impression: Questionable multinodular thyroid with low suspicion for nodules.   Fine-needle aspiration on September 09, 2019 Clinical History: 2.8 cm LML  Specimen Submitted:  A. THYROID GLAND, LEFT, FINE NEEDLE ASPIRATION:  FINAL MICROSCOPIC DIAGNOSIS:  - Consistent with benign follicular nodule (Bethesda category II)   SPECIMEN ADEQUACY:  Satisfactory for evaluation   Repeat surveillance thyroid ultrasound on September 29, 2020: Right lobe measures 4.8 cm, left lobe measures 5.2 cm.  3 nodules.  Right inferior 1.6 cm, 1 year follow-up recommended.  Left medial 2.7 cm previously biopsied with benign outcomes.  Nodule 3 in the left inferior 1.2 cm, 1 year follow-up recommended.   Assessment & Plan:   1. Multinodular goiter 2. Hashimoto's thyroiditis 3.  Type 2 diabetes 4.  Hypertension  -Biopsy result of her dominant thyroid nodule was reported to be negative.  Her repeat, surveillance thyroid ultrasound reveals unremarkable findings.  However, she will need a follow-up ultrasound in 1 year.  Patient with Hashimoto's thyroiditis on the background, and family history of lymphoma, needs documentation of 5-year stability of thyroid nodules.   -She is euthyroid clinically and biochemically.  He would not need intervention with thyroid hormone.  Regarding her interval diagnosed with type 2 diabetes: Recent A1c of 5.7%.  She is advised to continue metformin 500 mg p.o. once a day at breakfast.   Regarding her hypertension, she seems to be symptomatic of overtreatment.  She is advised to discontinue  amlodipine, and lower her lisinopril to 5 mg p.o. daily until she visits her PMD.  She also has hydrochlorothiazide 25 mg p.o. daily.   I did not initiate any new prescriptions today.  - she is advised to maintain close follow up with Abran Richard, MD for primary care needs.    I spent 21 minutes in the care of the patient today including review of labs from Thyroid Function, CMP, and other relevant labs ; imaging/biopsy records (current and previous including abstractions from other facilities); face-to-face time discussing  her lab results and symptoms, medications doses, her options of short and long term treatment based on the latest standards of care / guidelines;   and documenting the encounter.  Cassie Johnson  participated in the discussions, expressed understanding, and voiced agreement with the above plans.  All questions were answered to her satisfaction. she is encouraged to contact clinic should she have any questions or concerns prior to her return visit.   Follow up plan: Return in about 1 year (around 01/31/2022) for F/U with Pre-visit Labs, Thyroid / Neck Ultrasound.   Glade Lloyd, MD [facility] [address] [place], [place] [address] Phone: [phone]  Fax: [phone]     01/31/2021, 4:53 PM  This note was partially dictated with voice recognition software. Similar sounding words can be transcribed inadequately or may not  be corrected upon review.

## 2021-02-27 IMAGING — US US FNA BIOPSY THYROID 1ST LESION
1 series · 13 of 20 positions shown · non-contrast
Comparison: US from [HOSPITAL] -- August 24, 2019

INDICATION: Indeterminate thyroid nodule

Left mid lobe thyroid nodule
2.8 cm
EXAM:
ULTRASOUND GUIDED FINE NEEDLE ASPIRATION OF INDETERMINATE THYROID
NODULE
TECHNIQUE: Informed written consent was obtained from the patient after a
discussion of the risks, benefits and alternatives to treatment.
Questions regarding the procedure were encouraged and answered. A
timeout was performed prior to the initiation of the procedure.

[Series 1: us fna bx thyroid 1st lesion afirma · 20 acquisitions, 13 frames shown]
[im 1/20]
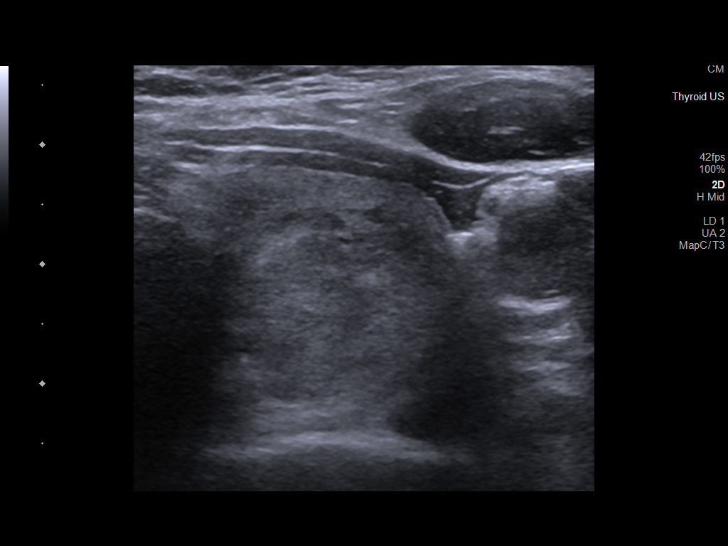
[im 3/20]
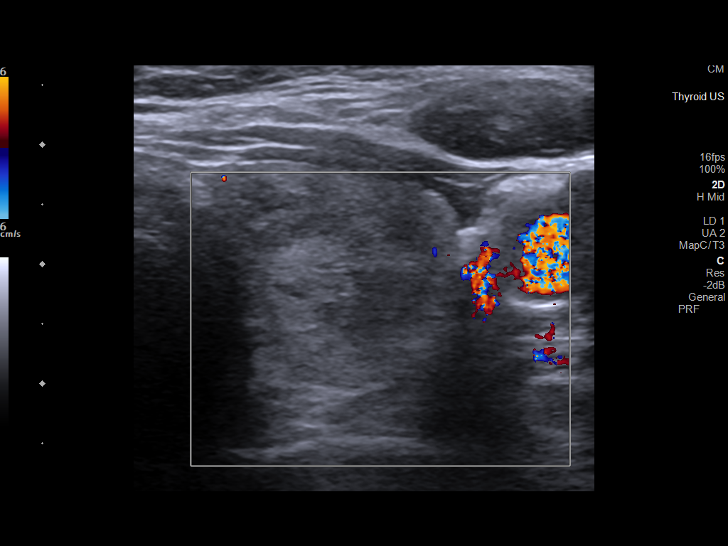
[im 4/20]
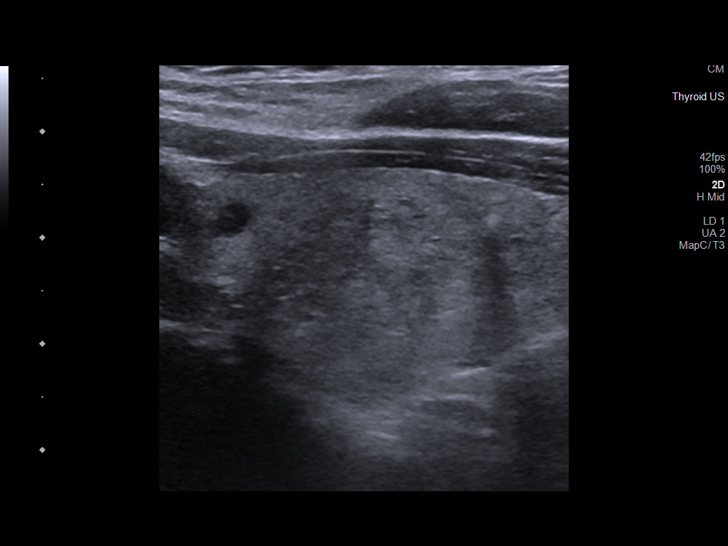
[im 6/20]
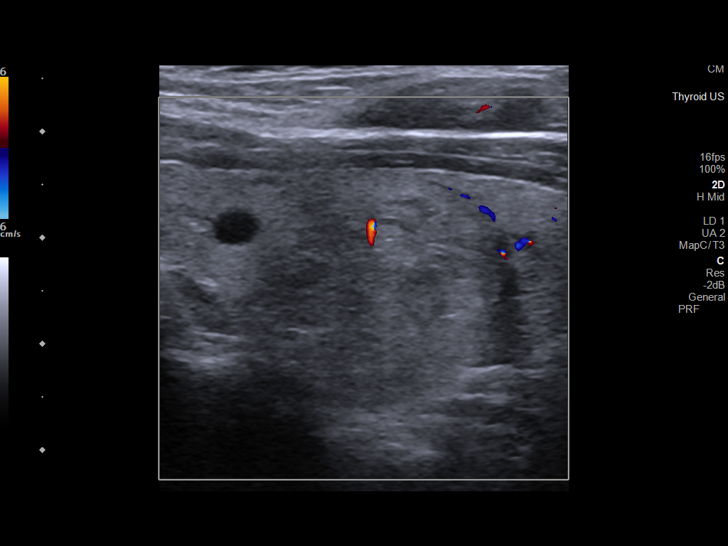
[im 7/20]
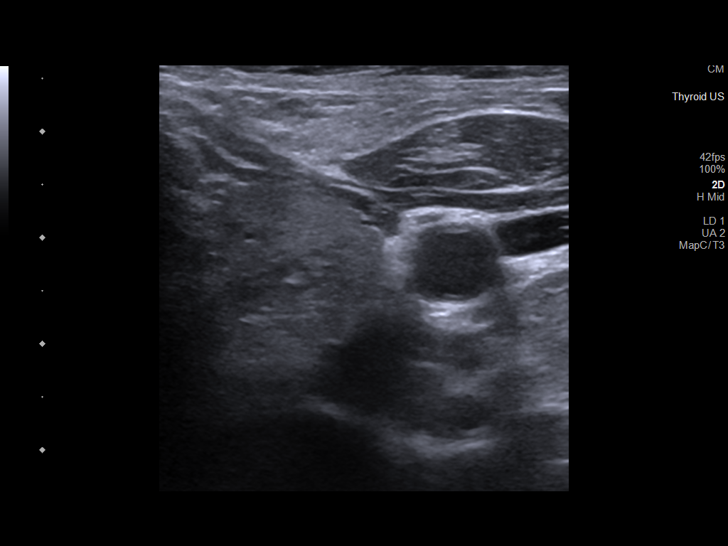
[im 9/20]
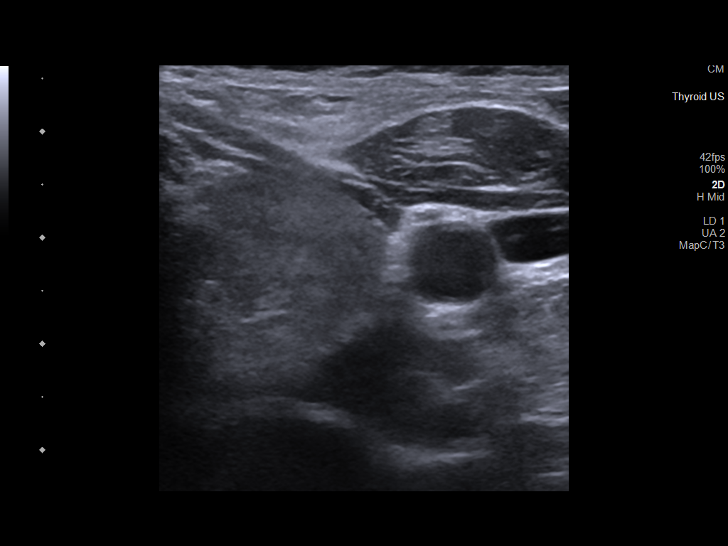
[im 11/20]
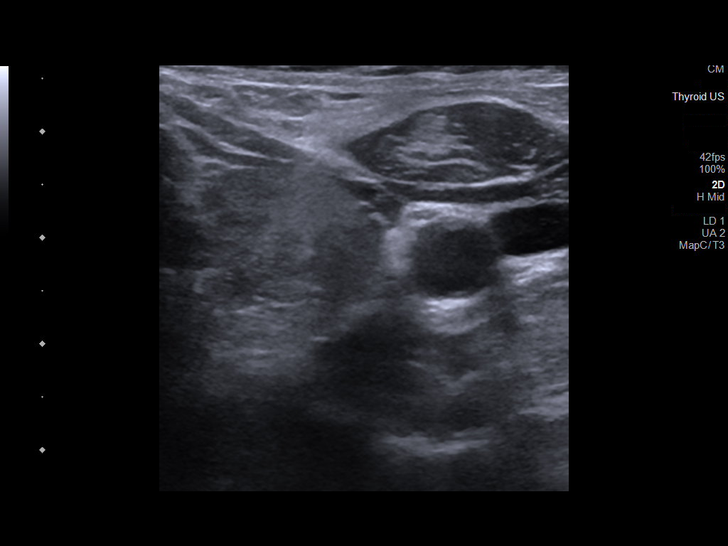
[im 12/20]
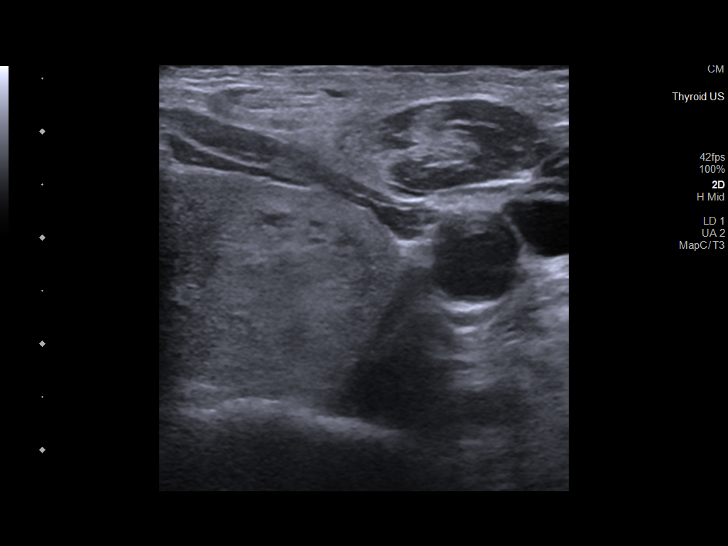
[im 14/20]
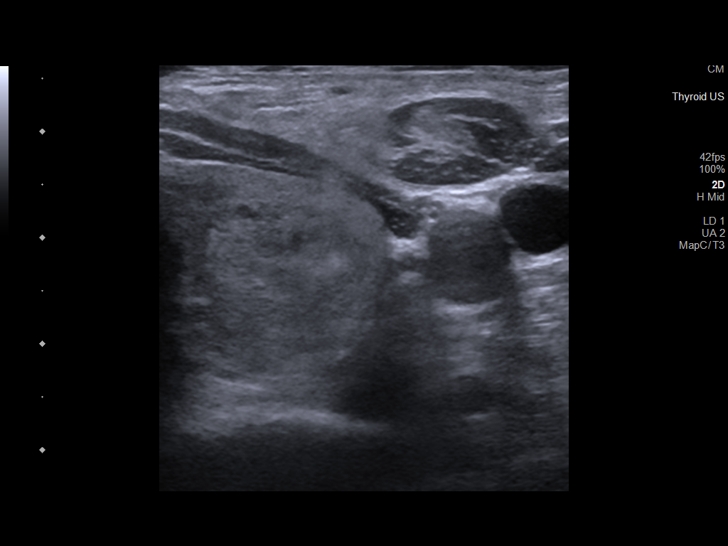
[im 15/20]
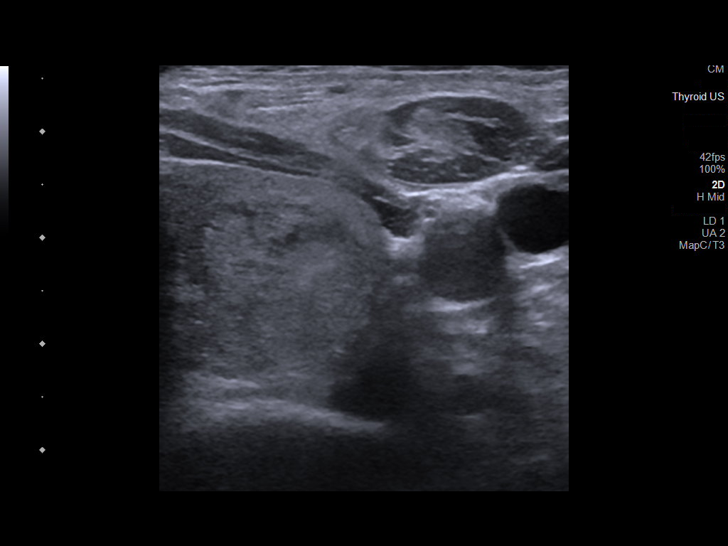
[im 17/20]
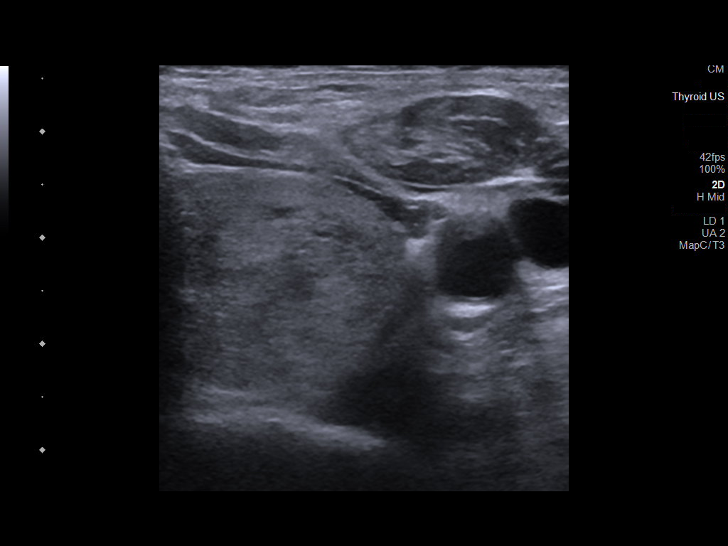
[im 18/20]
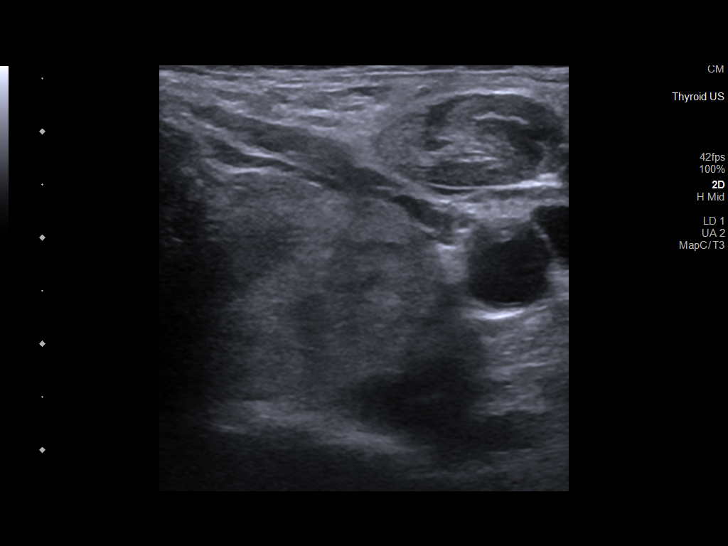
[im 20/20]
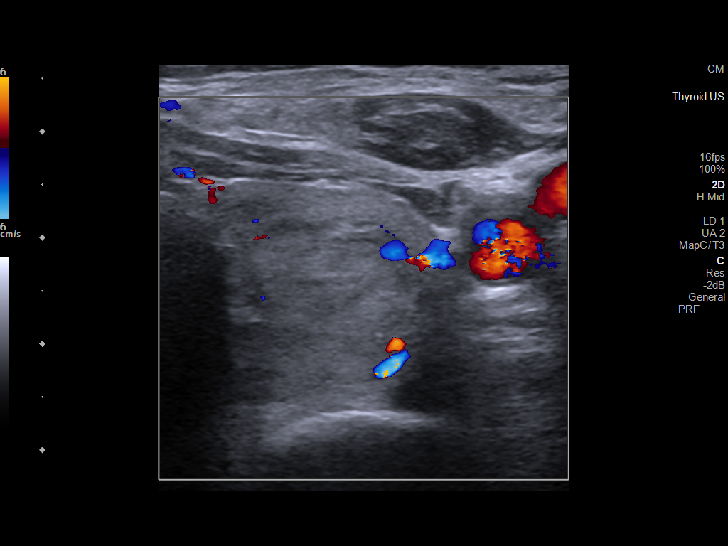

[13 of 20 positions shown; findings below may reference images not displayed]

Reviewed and approved by Dr Orange

MEDICATIONS:
10 cc 1% lidocaine

COMPLICATIONS:
None immediate.
Pre-procedural ultrasound scanning demonstrated left mid lobe
thyroid nodule

The procedure was planned. The neck was prepped in the usual sterile
fashion, and a sterile drape was applied covering the operative
field. A timeout was performed prior to the initiation of the
procedure. Local anesthesia was provided with 1% lidocaine.

Under direct ultrasound guidance, 6 FNA biopsies were performed
using 25 G needle of left mid lobe thyroid nodule.

Samples were prepared and submitted to pathology.

2 of these samples were obtained for AFIRMA if needed

Limited post procedural scanning was negative for hematoma or
additional complication. Dressings were placed. The patient
tolerated the above procedures procedure well without immediate
postprocedural complication.
IMPRESSION: Technically successful ultrasound guided fine needle aspiration of
left mid lobe thyroid nodule.

Read by

Prencesse Bri

## 2022-01-23 ENCOUNTER — Other Ambulatory Visit: Payer: Self-pay

## 2022-01-23 DIAGNOSIS — E042 Nontoxic multinodular goiter: Secondary | ICD-10-CM

## 2022-01-25 ENCOUNTER — Ambulatory Visit (HOSPITAL_COMMUNITY)
Admission: RE | Admit: 2022-01-25 | Discharge: 2022-01-25 | Disposition: A | Discharge status: Home / Self Care | Payer: Medicare Other | Source: Ambulatory Visit | Attending: "Endocrinology | Admitting: "Endocrinology

## 2022-01-25 DIAGNOSIS — E042 Nontoxic multinodular goiter: Secondary | ICD-10-CM | POA: Insufficient documentation

## 2022-01-26 LAB — T4, FREE: Free T4: 1.27 ng/dL (ref 0.82–1.77)

## 2022-01-26 LAB — T3, FREE: T3, Free: 2.9 pg/mL (ref 2.0–4.4)

## 2022-01-26 LAB — TSH: TSH: 1.69 u[IU]/mL (ref 0.450–4.500)

## 2022-01-31 ENCOUNTER — Ambulatory Visit (INDEPENDENT_AMBULATORY_CARE_PROVIDER_SITE_OTHER): Payer: Medicare Other | Admitting: "Endocrinology

## 2022-01-31 ENCOUNTER — Encounter: Payer: Self-pay | Admitting: "Endocrinology

## 2022-01-31 VITALS — BP 128/76 | HR 64 | Ht 62.0 in | Wt 153.8 lb

## 2022-01-31 DIAGNOSIS — E119 Type 2 diabetes mellitus without complications: Secondary | ICD-10-CM | POA: Diagnosis not present

## 2022-01-31 DIAGNOSIS — I10 Essential (primary) hypertension: Secondary | ICD-10-CM | POA: Diagnosis not present

## 2022-01-31 DIAGNOSIS — E042 Nontoxic multinodular goiter: Secondary | ICD-10-CM

## 2022-01-31 NOTE — Progress Notes (Signed)
01/31/2022, 5:46 PM   Endocrinology follow-up note  Subjective:    Patient ID: Cassie Johnson, female    DOB: [birth year], PCP Cassie Richard, MD   Past Medical History:  Diagnosis Date   High blood pressure    High cholesterol    Reflux    Urticaria    Past Surgical History:  Procedure Laterality Date   ABDOMINAL HYSTERECTOMY     CARPAL TUNNEL RELEASE     CHOLECYSTECTOMY     VESICOVAGINAL FISTULA CLOSURE W/ TAH     Social History   Socioeconomic History   Marital status: Widowed    Spouse name: Not on file   Number of children: Not on file   Years of education: Not on file   Highest education level: Not on file  Occupational History   Not on file  Tobacco Use   Smoking status: Former   Smokeless tobacco: Never  Vaping Use   Vaping Use: Never used  Substance and Sexual Activity   Alcohol use: Yes   Drug use: No   Sexual activity: Not on file  Other Topics Concern   Not on file  Social History Narrative   Not on file   Social Determinants of Health   Financial Resource Strain: Not on file  Food Insecurity: Not on file  Transportation Needs: Not on file  Physical Activity: Not on file  Stress: Not on file  Social Connections: Not on file   Family History  Problem Relation Age of Onset   Allergic rhinitis Sister    Hypertension Mother    Heart failure Mother    Asthma Neg Hx    Eczema Neg Hx    Urticaria Neg Hx    Angioedema Neg Hx    Atopy Neg Hx    Immunodeficiency Neg Hx    Outpatient Encounter Medications as of 01/31/2022  Medication Sig   cetirizine (ZYRTEC) 10 MG tablet Take 10 mg by mouth 2 (two) times daily.   ezetimibe (ZETIA) 10 MG tablet Take 10 mg by mouth daily.   FLUTICASONE PROPIONATE NA Place into the nose.   hydrochlorothiazide (HYDRODIURIL) 25 MG tablet Take 1 tablet by mouth daily.   latanoprost (XALATAN) 0.005 % ophthalmic solution Place 1 drop into both eyes daily at 12  noon.   lisinopril (ZESTRIL) 10 MG tablet Take 5 mg by mouth daily with breakfast.   metFORMIN (GLUCOPHAGE) 500 MG tablet Take 1 tablet (500 mg total) by mouth daily with breakfast.   Facility-Administered Encounter Medications as of 01/31/2022  Medication   omalizumab Arvid Right) injection 300 mg   ALLERGIES: Allergies  Allergen Reactions   Prednisone    Statins    Sulfa Drugs Cross Reactors     VACCINATION STATUS:  There is no immunization history on file for this patient.  HPI Cassie Johnson is 76 y.o. female who presents today with a medical history as above. she is being seen in follow-up after she was seen consultation for multinodular goiter requested by Cassie Richard, MD. -See notes from prior visits.   -During her previous visits she was sent for a biopsy of left-sided thyroid nodule with benign findings.  She is returning for follow-up with repeat thyroid  ultrasound and thyroid function test.  She has no new complaints.     Patient denies any family history of thyroid malignancy, however she reports that her mother was diagnosed with unidentified lymphoma.  Her medical records reveals antithyroid antibodies including TPO and thyroglobulin antibodies elevated in May 2020.  Her previsit thyroid function tests are still consistent with euthyroid state.  She is not on thyroid hormone supplement or antithyroid intervention at this time.   She denies palpitations, tremors. She denies dysphagia, shortness of breath, nor voice change. She denies any exposure to neck radiation. -She was diagnosed with type 2 diabetes prior to her last visit.  She was started on low-dose metformin, however patient is not taking it consistently.  Her point-of-care A1c is 5.8%.     She is on amlodipine, hydrochlorothiazide, and lisinopril for blood pressure management.  She complains of dropping blood pressure at home and she registered blood pressure 128/76 mmHg in the clinic today.     Review of  Systems    Objective:       01/31/2022    9:26 AM 01/31/2021    9:59 AM 09/29/2020   11:01 AM  Vitals with BMI  Height '5\' 2"'$  '5\' 2"'$  '5\' 2"'$   Weight 153 lbs 13 oz 142 lbs 13 oz 144 lbs 3 oz  BMI 28.12 20.25 42.70  Systolic 623 762 831  Diastolic 76 54 58  Pulse 64 76 72    BP 128/76   Pulse 64   Ht '5\' 2"'$  (1.575 m)   Wt 153 lb 12.8 oz (69.8 kg)   BMI 28.13 kg/m   Wt Readings from Last 3 Encounters:  01/31/22 153 lb 12.8 oz (69.8 kg)  01/31/21 142 lb 12.8 oz (64.8 kg)  09/29/20 144 lb 3.2 oz (65.4 kg)    Physical Exam  Constitutional:  Body mass index is 28.13 kg/m.,  not in acute distress, normal state of mind Eyes: PERRLA, EOMI, no exophthalmos ENT: moist mucous membranes, + gross thyromegaly, + palpable firm right submandibular gland, enlarged soft to firm right submandibular gland, + documented cervical lymphadenopathy bilaterally on ultrasound.      Lab Results  Component Value Date   TSH 1.690 01/25/2022   TSH 1.430 09/22/2020   TSH 1.680 08/07/2019   TSH 1.800 09/30/2018   FREET4 1.27 01/25/2022   FREET4 1.29 09/22/2020   FREET4 1.09 08/07/2019       Ultrasound of neck/soft tissue on August 04, 2019: Prominent heterogeneous submandibular glands with prominent bilateral submandibular lymph nodes with normal-appearing morphology.  This could be secondary to inflammation or infection.  No evidence of sialolithiasis or dilated ducts.  Dedicated thyroid ultrasound from [place] on August 24, 2019: Right lobe 5.3 x 2.4 x 1.8 cm, left lobe 5.2 x 2.2 x 2.4 cm.  There is diffuse heterogeneous appearance of the thyroid gland.  There is a questionable dominant left mid gland nodule measuring 2.8 cm.  It appears oval in shape, smoothly marginated, hypoechoic with scattered vascularity.  No central constipation.  Additional similar-appearing nodules are present bilaterally, lobe smaller.  Impression: Questionable multinodular thyroid with low suspicion for  nodules.   Fine-needle aspiration on September 09, 2019 Clinical History: 2.8 cm LML  Specimen Submitted:  A. THYROID GLAND, LEFT, FINE NEEDLE ASPIRATION:  FINAL MICROSCOPIC DIAGNOSIS:  - Consistent with benign follicular nodule (Bethesda category II)   SPECIMEN ADEQUACY:  Satisfactory for evaluation   Repeat surveillance thyroid ultrasound on September 29, 2020: Right lobe measures 4.8 cm,  left lobe measures 5.2 cm.  3 nodules.  Right inferior 1.6 cm, 1 year follow-up recommended.  Left medial 2.7 cm previously biopsied with benign outcomes.  Nodule 3 in the left inferior 1.2 cm, 1 year follow-up recommended.   Her repeat surveillance thyroid/neck ultrasound on January 25, 2022: IMPRESSION: 1. Multinodular thyroid gland.  No new or enlarging thyroid nodules. 2. Similar appearance of previously biopsied nodule labeled 2 in the mid left thyroid lobe. Correlate with biopsy results. 3. Nodule labeled 1 in the inferior right thyroid lobe continues to meet criteria for follow-up ultrasound in 1 year. This exam demonstrates 2 year stability. A total follow-up interval of 5 years is recommended.   Assessment & Plan:   1. Multinodular goiter-stable 2. Hashimoto's thyroiditis 3.  Type 2 diabetes 4.  Hypertension  -Prior to her last visit, biopsy result of her dominant thyroid nodule was reported to be negative.  Her repeat, surveillance thyroid ultrasound reveals stable/unremarkable findings.  However, she will need a follow-up ultrasound in 1 -2 year.  Patient with Hashimoto's thyroiditis on the background, and family history of lymphoma, needs documentation of 5-year stability of thyroid nodules.   -She is euthyroid clinically and biochemically.  She will not need intervention with thyroid hormone or antithyroid medication at this time.    Regarding her recent diagnosis of type 2 diabetes with point-of-care A1c of 5.8%, and her hesitance to take metformin, coupled with her hypertension still  makes her a good candidate for lifestyle medicine.   Whole food plant-based diet and other lifestyle medicine pillars were discussed in detail with her.  She is advised to continue lisinopril 5 mg p.o. daily at breakfast, and hydrochlorothiazide 25 mg p.o. daily at breakfast.  She was taken off of amlodipine during her last visit.   I did not initiate any new prescriptions today.  - she is advised to maintain close follow up with Cassie Richard, MD for primary care needs.   I spent 21 minutes in the care of the patient today including review of labs from Thyroid Function, CMP, and other relevant labs ; imaging/biopsy records (current and previous including abstractions from other facilities); face-to-face time discussing  her lab results and symptoms, medications doses, her options of short and long term treatment based on the latest standards of care / guidelines;   and documenting the encounter.  Cassie Johnson  participated in the discussions, expressed understanding, and voiced agreement with the above plans.  All questions were answered to her satisfaction. she is encouraged to contact clinic should she have any questions or concerns prior to her return visit.   Follow up plan: Return in about 1 year (around 02/01/2023) for F/U with Pre-visit Labs, A1c -NV.   Cassie Lloyd, MD [facility] [address] [place], [place] [address] Phone: [phone]  Fax: [phone]     01/31/2022, 5:46 PM  This note was partially dictated with voice recognition software. Similar sounding words can be transcribed inadequately or may not  be corrected upon review.

## 2022-04-26 ENCOUNTER — Encounter: Payer: Self-pay | Admitting: Radiology

## 2022-05-22 IMAGING — US US THYROID
1 series · 13 of 25 positions shown · non-contrast
Comparison: None available

CLINICAL DATA: Prior ultrasound follow-up.

EXAM:
THYROID ULTRASOUND
TECHNIQUE: Ultrasound examination of the thyroid gland and adjacent soft
tissues was performed.

[Series 1: us thyroid · 13 of 69 slices shown]
[im 1/69]
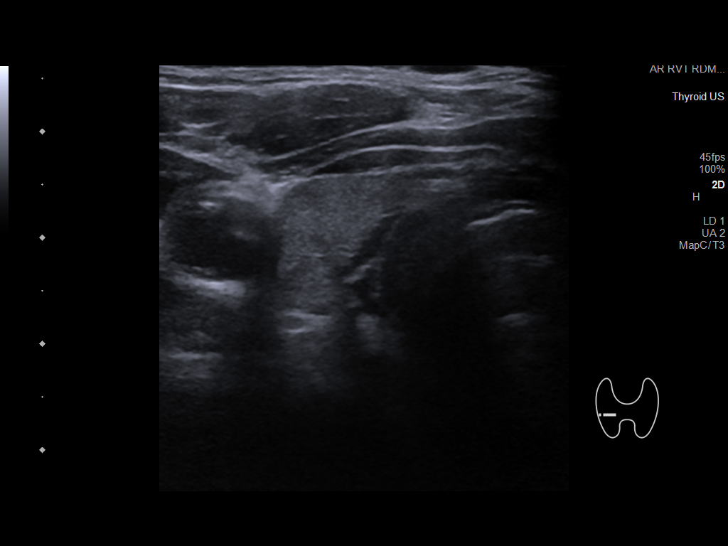
[im 6/69]
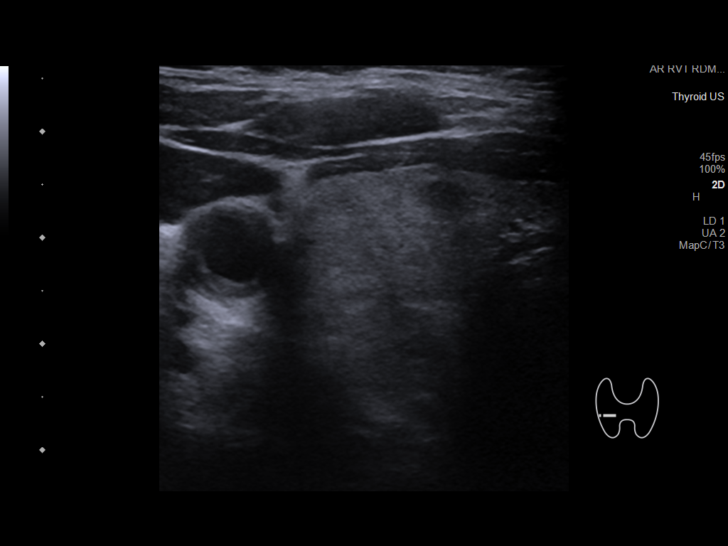
[im 12/69]
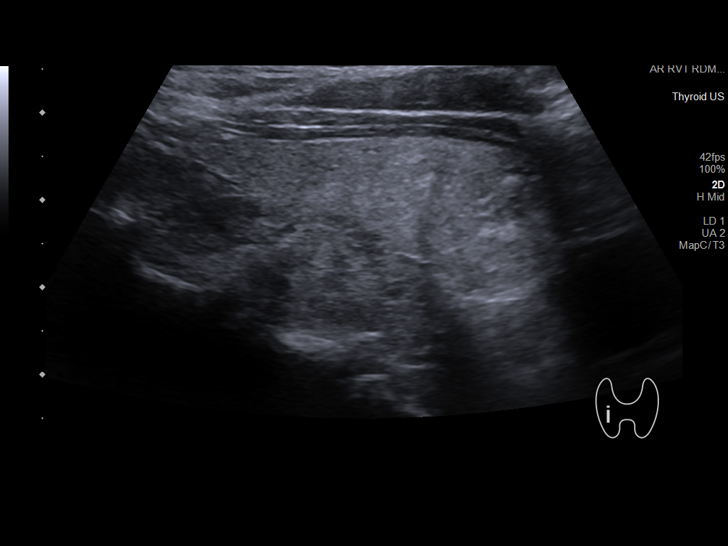
[im 18/69]
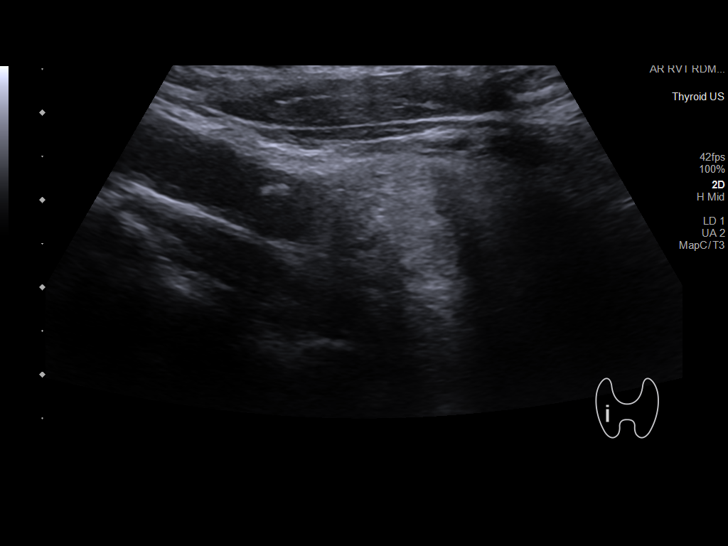
[im 23/69]
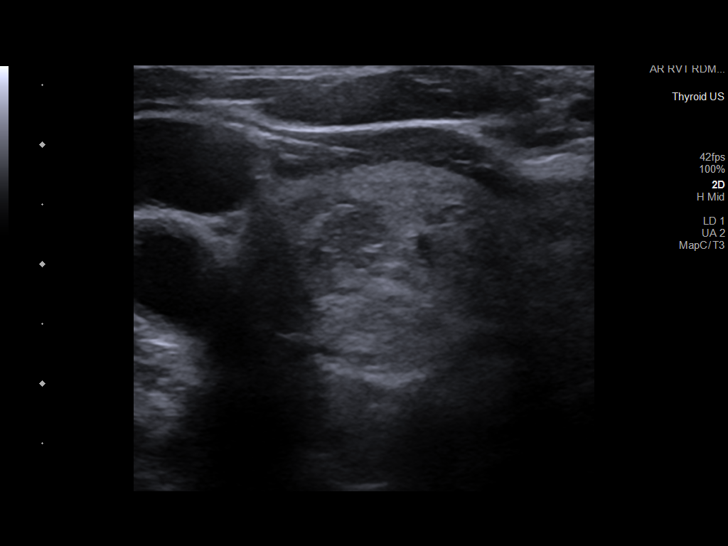
[im 29/69]
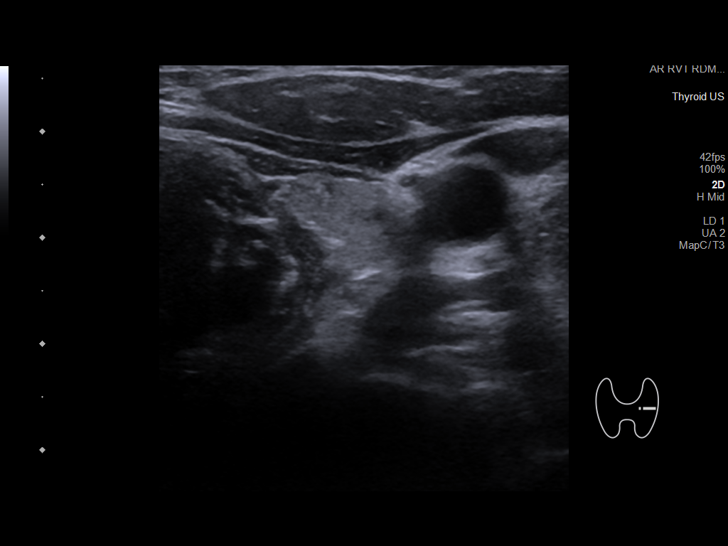
[im 35/69]
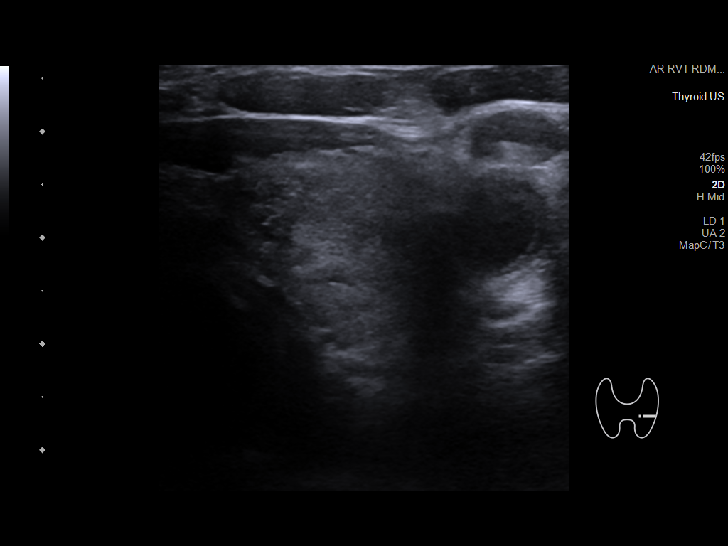
[im 40/69]
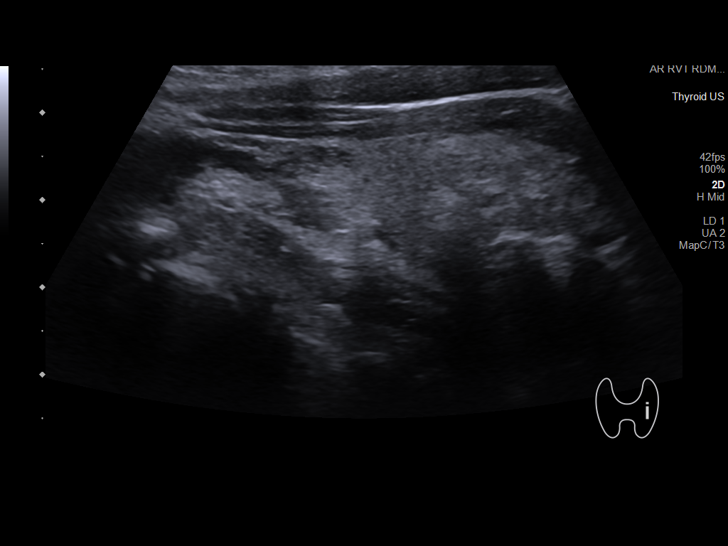
[im 46/69]
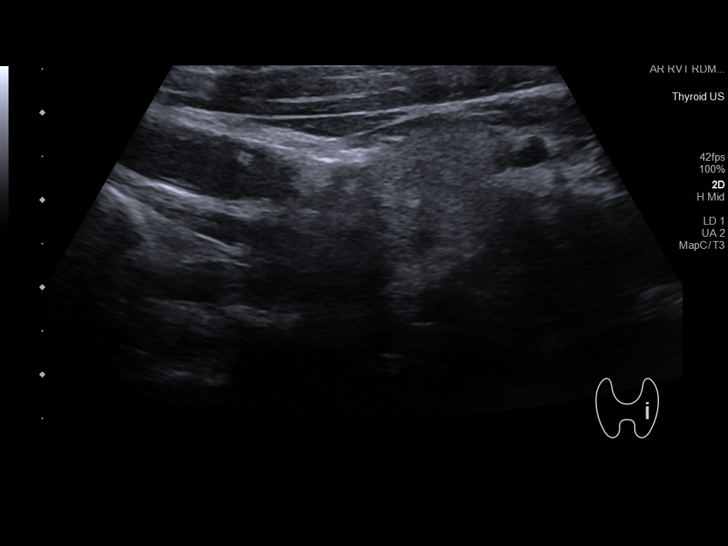
[im 52/69]
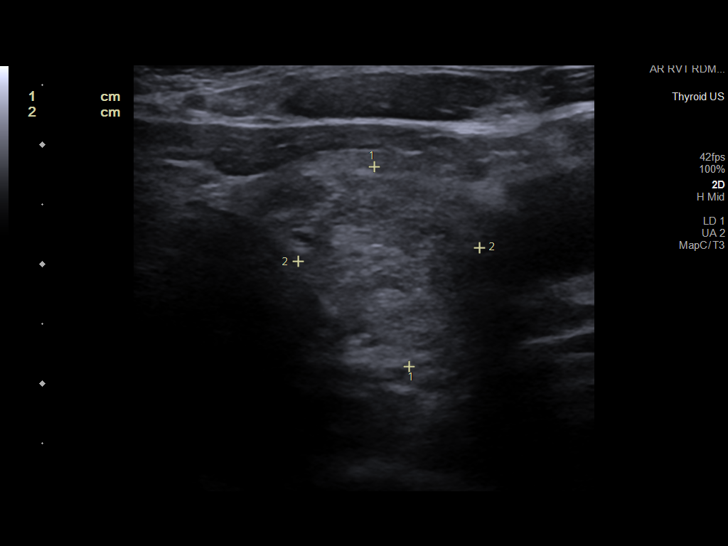
[im 57/69]
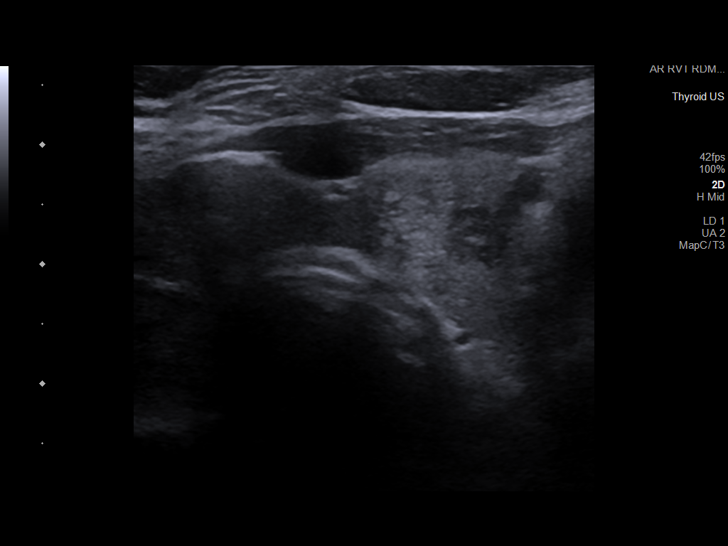
[im 63/69]
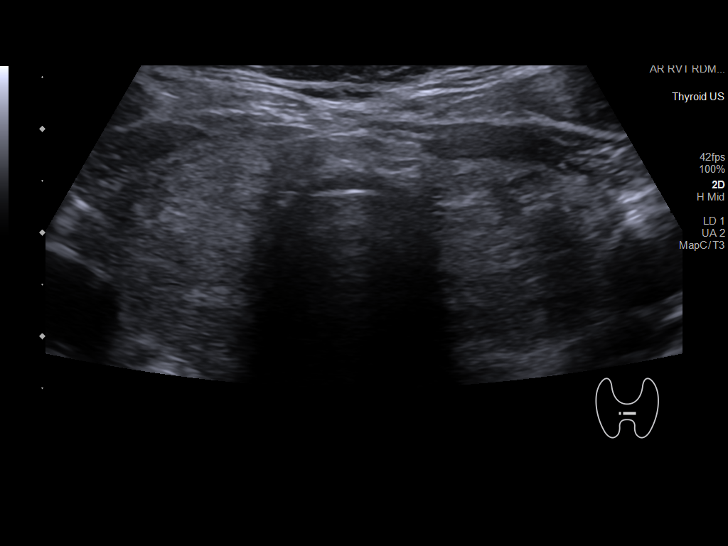
[im 69/69]
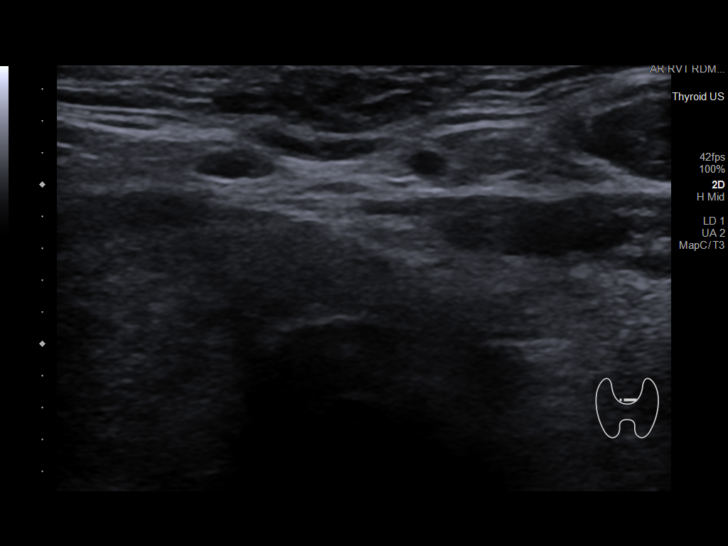

[13 of 25 positions shown; findings below may reference images not displayed]

FINDINGS: Parenchymal Echotexture: Moderately heterogenous

Isthmus: 0.4 cm

Right lobe: 4.8 x 2.5 x 1.4 cm

Left lobe: 5.2 x 2.5 x 1.8 cm

_________________________________________________________

Estimated total number of nodules >/= 1 cm: 3

Number of spongiform nodules >/=  2 cm not described below (TR1): 0

Number of mixed cystic and solid nodules >/= 1.5 cm not described
below (TR2): 0

_________________________________________________________

Nodule # 1:

Location: Right; Inferior

Maximum size: 1.6 cm; Other 2 dimensions: 1.3 x 1.2 cm

Composition: solid/almost completely solid (2)

Echogenicity: isoechoic (1)

Shape: not taller-than-wide (0)

Margins: ill-defined (0)

Echogenic foci: none (0)

ACR TI-RADS total points: 3.

ACR TI-RADS risk category: TR3 (3 points).

ACR TI-RADS recommendations:

*Given size (>/= 1.5 - 2.4 cm) and appearance, a follow-up
ultrasound in 1 year should be considered based on TI-RADS criteria.

_________________________________________________________

Nodule labeled 2 refers to a 2.7 cm solid isoechoic nodule in the
mid aspect of the left thyroid lobe which was previously biopsied in
August 2019.

Nodule # 3:

Location: Left; Inferior

Maximum size: 1.2 cm; Other 2 dimensions: 0.9 x 0.8 cm

Composition: solid/almost completely solid (2)

Echogenicity: isoechoic (1)

Shape: taller-than-wide (3)

Margins: smooth (0)

Echogenic foci: none (0)

ACR TI-RADS total points: 5.

ACR TI-RADS risk category: TR4 (4-6 points).

ACR TI-RADS recommendations:

*Given size (>/= 1 - 1.4 cm) and appearance, a follow-up ultrasound
in 1 year should be considered based on TI-RADS criteria.

_________________________________________________________
IMPRESSION: 1. Multinodular thyroid gland.
2. Nodule labeled 2 was previously biopsied in August 2019. Correlate
with biopsy results.
3. Nodules labeled 1 and 3 meet criteria for follow-up ultrasound in
1 year. Correlate with previous ultrasound exams if available.

The above is in keeping with the ACR TI-RADS recommendations - [HOSPITAL] 0523;[DATE].

## 2022-11-13 ENCOUNTER — Emergency Department (HOSPITAL_COMMUNITY)
Admission: EM | Admit: 2022-11-13 | Discharge: 2022-11-13 | Disposition: A | Discharge status: Home / Self Care | Payer: Medicare Other | Attending: Student | Admitting: Student

## 2022-11-13 ENCOUNTER — Other Ambulatory Visit: Payer: Self-pay

## 2022-11-13 ENCOUNTER — Encounter (HOSPITAL_COMMUNITY): Payer: Self-pay | Admitting: *Deleted

## 2022-11-13 ENCOUNTER — Emergency Department (HOSPITAL_COMMUNITY): Payer: Medicare Other

## 2022-11-13 DIAGNOSIS — I4510 Unspecified right bundle-branch block: Secondary | ICD-10-CM | POA: Diagnosis not present

## 2022-11-13 DIAGNOSIS — R0609 Other forms of dyspnea: Secondary | ICD-10-CM | POA: Insufficient documentation

## 2022-11-13 DIAGNOSIS — I10 Essential (primary) hypertension: Secondary | ICD-10-CM | POA: Diagnosis not present

## 2022-11-13 DIAGNOSIS — Z79899 Other long term (current) drug therapy: Secondary | ICD-10-CM | POA: Insufficient documentation

## 2022-11-13 DIAGNOSIS — R9431 Abnormal electrocardiogram [ECG] [EKG]: Secondary | ICD-10-CM | POA: Diagnosis not present

## 2022-11-13 DIAGNOSIS — Z7984 Long term (current) use of oral hypoglycemic drugs: Secondary | ICD-10-CM | POA: Diagnosis not present

## 2022-11-13 DIAGNOSIS — E119 Type 2 diabetes mellitus without complications: Secondary | ICD-10-CM | POA: Insufficient documentation

## 2022-11-13 DIAGNOSIS — Z87891 Personal history of nicotine dependence: Secondary | ICD-10-CM | POA: Diagnosis not present

## 2022-11-13 DIAGNOSIS — R0600 Dyspnea, unspecified: Secondary | ICD-10-CM

## 2022-11-13 LAB — URINALYSIS, ROUTINE W REFLEX MICROSCOPIC
Bilirubin Urine: NEGATIVE
Glucose, UA: NEGATIVE mg/dL
Hgb urine dipstick: NEGATIVE
Ketones, ur: NEGATIVE mg/dL
Nitrite: NEGATIVE
Protein, ur: NEGATIVE mg/dL
Specific Gravity, Urine: 1.014 (ref 1.005–1.030)
pH: 5 (ref 5.0–8.0)

## 2022-11-13 LAB — BASIC METABOLIC PANEL
Anion gap: 10 (ref 5–15)
BUN: 30 mg/dL — ABNORMAL HIGH (ref 8–23)
CO2: 23 mmol/L (ref 22–32)
Calcium: 9.5 mg/dL (ref 8.9–10.3)
Chloride: 104 mmol/L (ref 98–111)
Creatinine, Ser: 1.38 mg/dL — ABNORMAL HIGH (ref 0.44–1.00)
GFR, Estimated: 40 mL/min — ABNORMAL LOW (ref >60)
Glucose, Bld: 114 mg/dL — ABNORMAL HIGH (ref 70–99)
Potassium: 3.9 mmol/L (ref 3.5–5.1)
Sodium: 137 mmol/L (ref 135–145)

## 2022-11-13 LAB — CBC WITH DIFFERENTIAL/PLATELET
Abs Immature Granulocytes: 0.02 10*3/uL (ref 0.00–0.07)
Basophils Absolute: 0.1 10*3/uL (ref 0.0–0.1)
Basophils Relative: 1 %
Eosinophils Absolute: 0.4 10*3/uL (ref 0.0–0.5)
Eosinophils Relative: 5 %
HCT: 45.6 % (ref 36.0–46.0)
Hemoglobin: 14.7 g/dL (ref 12.0–15.0)
Immature Granulocytes: 0 %
Lymphocytes Relative: 36 %
Lymphs Abs: 3 10*3/uL (ref 0.7–4.0)
MCH: 28.6 pg (ref 26.0–34.0)
MCHC: 32.2 g/dL (ref 30.0–36.0)
MCV: 88.7 fL (ref 80.0–100.0)
Monocytes Absolute: 0.6 10*3/uL (ref 0.1–1.0)
Monocytes Relative: 7 %
Neutro Abs: 4.2 10*3/uL (ref 1.7–7.7)
Neutrophils Relative %: 51 %
Platelets: 279 10*3/uL (ref 150–400)
RBC: 5.14 MIL/uL — ABNORMAL HIGH (ref 3.87–5.11)
RDW: 12.9 % (ref 11.5–15.5)
WBC: 8.2 10*3/uL (ref 4.0–10.5)
nRBC: 0 % (ref 0.0–0.2)

## 2022-11-13 LAB — TROPONIN I (HIGH SENSITIVITY)
Troponin I (High Sensitivity): 4 ng/L (ref <18)
Troponin I (High Sensitivity): 4 ng/L (ref <18)

## 2022-11-13 NOTE — ED Provider Notes (Signed)
[place] EMERGENCY DEPARTMENT AT [facility] Provider Note  CSN: [id number] Arrival date & time: 11/13/22 1608  Chief Complaint(s) abnormal EKG  HPI Cassie Johnson is a 77 y.o. female with PMH T2DM, HTN, HLD, Hashimoto's thyroiditis who presents emergency department for evaluation of dyspnea on exertion and an "abnormal EKG".  Patient states that over the last month she has been feeling fatigue and weakness in her hips and groin.  She also states that over the last 2 weeks she feels like she needs to stop after walking due to feeling winded.  She is endorsing decreased exercise tolerance over the last 1 month and now can only last approximately 10 minutes on the elliptical before having to stop.  No associated chest pain.  Patient received an EKG at her primary care physician today that was reportedly abnormal and sent her to the emergency department.  On arrival, patient brings an EKG with a right bundle branch block and no evidence of ischemia.   Past Medical History Past Medical History:  Diagnosis Date   High blood pressure    High cholesterol    Reflux    Urticaria    Patient Active Problem List   Diagnosis Date Noted   Type 2 diabetes mellitus without complication, without long-term current use of insulin (HCC) 01/31/2022   Uncontrolled type 2 diabetes mellitus with hyperglycemia (HCC) 09/29/2020   Multinodular goiter 08/07/2019   Hashimoto's thyroiditis 08/07/2019   Submandibular swelling 08/07/2019   Essential hypertension, benign 08/07/2019   Allergic urticaria 07/23/2018   Seasonal and perennial allergic rhinitis 07/23/2018   Spinal stenosis of lumbar region 06/21/2011   Lumbar herniated disc 06/06/2011   SCIATICA 08/01/2009   LUMBAR SPRAIN AND STRAIN 08/01/2009   HIGH BLOOD PRESSURE 08/01/2009   Home Medication(s) Prior to Admission medications   Medication Sig Start Date End Date Taking? Authorizing Provider  cetirizine (ZYRTEC) 10 MG tablet Take 10 mg  by mouth 2 (two) times daily.    [provider]  ezetimibe (ZETIA) 10 MG tablet Take 10 mg by mouth daily. 10/13/20   [provider]  FLUTICASONE PROPIONATE NA Place into the nose.    [provider]  hydrochlorothiazide (HYDRODIURIL) 25 MG tablet Take 1 tablet by mouth daily. 04/19/20   [provider]  latanoprost (XALATAN) 0.005 % ophthalmic solution Place 1 drop into both eyes daily at 12 noon. 04/14/18   [provider]  lisinopril (ZESTRIL) 10 MG tablet Take 5 mg by mouth daily with breakfast. 01/26/21   [provider]  metFORMIN (GLUCOPHAGE) 500 MG tablet Take 1 tablet (500 mg total) by mouth daily with breakfast. 01/31/21 03/02/21  Roma Kayser, MD                                                                                                                                    Past Surgical History Past Surgical History:  Procedure  Laterality Date   ABDOMINAL HYSTERECTOMY     CARPAL TUNNEL RELEASE     CHOLECYSTECTOMY     VESICOVAGINAL FISTULA CLOSURE W/ TAH     Family History Family History  Problem Relation Age of Onset   Allergic rhinitis Sister    Hypertension Mother    Heart failure Mother    Asthma Neg Hx    Eczema Neg Hx    Urticaria Neg Hx    Angioedema Neg Hx    Atopy Neg Hx    Immunodeficiency Neg Hx     Social History Social History   Tobacco Use   Smoking status: Former   Smokeless tobacco: Never  Advertising account planner   Vaping status: Never Used  Substance Use Topics   Alcohol use: Yes   Drug use: No   Allergies Prednisone, Statins, and Sulfa drugs cross reactors  Review of Systems Review of Systems  Respiratory:  Positive for shortness of breath.     Physical Exam Vital Signs  I have reviewed the triage vital signs BP (!) 164/64   Pulse (!) 58   Temp 98.1 F (36.7 C) (Oral)   Resp 13   Ht 5\' 2"  (1.575 m)   Wt 70.3 kg   SpO2 100%   BMI 28.35 kg/m   Physical Exam Vitals and nursing note  reviewed.  Constitutional:      General: She is not in acute distress.    Appearance: She is well-developed.  HENT:     Head: Normocephalic and atraumatic.  Eyes:     Conjunctiva/sclera: Conjunctivae normal.  Cardiovascular:     Rate and Rhythm: Normal rate and regular rhythm.     Heart sounds: No murmur heard. Pulmonary:     Effort: Pulmonary effort is normal. No respiratory distress.     Breath sounds: Normal breath sounds.  Abdominal:     Palpations: Abdomen is soft.     Tenderness: There is no abdominal tenderness.  Musculoskeletal:        General: No swelling.     Cervical back: Neck supple.  Skin:    General: Skin is warm and dry.     Capillary Refill: Capillary refill takes less than 2 seconds.  Neurological:     Mental Status: She is alert.  Psychiatric:        Mood and Affect: Mood normal.     ED Results and Treatments Labs (all labs ordered are listed, but only abnormal results are displayed) Labs Reviewed  CBC WITH DIFFERENTIAL/PLATELET - Abnormal; Notable for the following components:      Result Value   RBC 5.14 (*)    All other components within normal limits  BASIC METABOLIC PANEL  URINALYSIS, ROUTINE W REFLEX MICROSCOPIC  TROPONIN I (HIGH SENSITIVITY)                                                                                                                          Radiology DG Chest 2 View  Result Date: 11/13/2022 CLINICAL DATA:  Chest pain EXAM: CHEST - 2 VIEW COMPARISON:  None Available. FINDINGS: The heart size and mediastinal contours are within normal limits. No consolidation, pneumothorax or effusion. No edema. Calcified aorta. Surgical clips in the right upper quadrant. The visualized skeletal structures are unremarkable. IMPRESSION: No acute cardiopulmonary disease. Electronically Signed   By: Karen Kays M.D.   On: 11/13/2022 17:56    Pertinent labs & imaging results that were available during my care of the patient were reviewed by me  and considered in my medical decision making (see MDM for details).  Medications Ordered in ED Medications - No data to display                                                                                                                                   Procedures Procedures  (including critical care time)  Medical Decision Making / ED Course   This patient presents to the ED for concern of shortness of breath, abnormal EKG, this involves an extensive number of treatment options, and is a complaint that carries with it a high risk of complications and morbidity.  The differential diagnosis includes Pe, PTX, Pulmonary Edema, ARDS, COPD/Asthma, ACS, CHF exacerbation, Arrhythmia, Pericardial Effusion/Tamponade, Anemia, Sepsis, Acidosis/Hypercapnia, Anxiety, Viral URI  MDM: Patient seen emergency room for evaluation of shortness of breath" abnormal EKG".  Physical exam is unremarkable with no murmurs, no wheezing.  No lower extremity edema.  Laboratory evaluation with a BUN of 30, creatinine 1.38, high-sensitivity troponin is unremarkable.  Chest x-ray unremarkable.  ECG in question does not demonstrate ischemia and our EKG here is similar.  The "abnormal" finding is a right bundle branch block and given her exertional shortness of breath I did place an outpatient cardiology referral.  An ambulatory pulse ox was performed here in the emergency department patient did not have any hypoxia or demonstrable shortness of breath.  Low risk Wells criteria and I have low suspicion for PE.  At this time she does not meet inpatient criteria for admission she is safe for discharge with outpatient follow-up.  Patient given return precautions of which she voiced understanding.    Additional history obtained:  -External records from outside source obtained and reviewed including: Chart review including previous notes, labs, imaging, consultation notes   Lab Tests: -I ordered, reviewed, and interpreted  labs.   The pertinent results include:   Labs Reviewed  CBC WITH DIFFERENTIAL/PLATELET - Abnormal; Notable for the following components:      Result Value   RBC 5.14 (*)    All other components within normal limits  BASIC METABOLIC PANEL  URINALYSIS, ROUTINE W REFLEX MICROSCOPIC  TROPONIN I (HIGH SENSITIVITY)      EKG   EKG Interpretation Date/Time:  Tuesday November 13 2022 16:44:47 EDT Ventricular Rate:  64 PR Interval:  148 QRS Duration:  124 QT Interval:  424 QTC Calculation: 437 R  Axis:   13  Text Interpretation: Normal sinus rhythm Right bundle branch block Abnormal ECG When compared with ECG of 29-May-2020 11:28, PREVIOUS ECG IS PRESENT Confirmed by Kalifa Cadden ([id number]) on 11/13/2022 6:02:50 PM         Imaging Studies ordered: I ordered imaging studies including chest x-ray I independently visualized and interpreted imaging. I agree with the radiologist interpretation   Medicines ordered and prescription drug management: No orders of the defined types were placed in this encounter.   -I have reviewed the patients home medicines and have made adjustments as needed  Critical interventions none   Cardiac Monitoring: The patient was maintained on a cardiac monitor.  I personally viewed and interpreted the cardiac monitored which showed an underlying rhythm of: Right bundle branch block  Social Determinants of Health:  Factors impacting patients care include: none   Reevaluation: After the interventions noted above, I reevaluated the patient and found that they have :stayed the same  Co morbidities that complicate the patient evaluation  Past Medical History:  Diagnosis Date   High blood pressure    High cholesterol    Reflux    Urticaria       Dispostion: I considered admission for this patient, but at this time she does not meet inpatient criteria for admission she is safe for discharge with outpatient follow-up     Final Clinical  Impression(s) / ED Diagnoses Final diagnoses:  None     @PCDICTATION @    Glendora Score, MD 11/14/22 [phone]

## 2022-11-13 NOTE — ED Notes (Signed)
Ambulated pt with pulse ox O2 sats remained 98-100%RA HR 80-84 while walking Pt denies CP and SOB   Informed MD

## 2022-11-13 NOTE — ED Triage Notes (Signed)
Pt sent from PCP office for abnormal EKG.  Pt admits left side CP dull x 2 weeks and SOB with exertion.  Pain to bilateral groin with ambulating. Numbness at times to front of thigh with ambulation.

## 2022-12-19 ENCOUNTER — Encounter: Payer: Self-pay | Admitting: Internal Medicine

## 2022-12-19 ENCOUNTER — Ambulatory Visit: Payer: Medicare Other | Attending: Internal Medicine | Admitting: Internal Medicine

## 2022-12-19 VITALS — BP 162/84 | HR 68 | Ht 62.0 in | Wt 156.4 lb

## 2022-12-19 DIAGNOSIS — M79606 Pain in leg, unspecified: Secondary | ICD-10-CM | POA: Diagnosis present

## 2022-12-19 DIAGNOSIS — I10 Essential (primary) hypertension: Secondary | ICD-10-CM | POA: Insufficient documentation

## 2022-12-19 DIAGNOSIS — E7849 Other hyperlipidemia: Secondary | ICD-10-CM | POA: Insufficient documentation

## 2022-12-19 NOTE — Patient Instructions (Addendum)
Medication Instructions:  Your physician recommends that you continue on your current medications as directed. Please refer to the Current Medication list given to you today.   Labwork: None  Testing/Procedures: Your physician has requested that you have an ankle brachial index (ABI). During this test an ultrasound and blood pressure cuff are used to evaluate the arteries that supply the arms and legs with blood. Allow thirty minutes for this exam. There are no restrictions or special instructions.  Your physician has requested that you have a lower or upper extremity arterial duplex. This test is an ultrasound of the arteries in the legs or arms. It looks at arterial blood flow in the legs and arms. Allow one hour for Lower and Upper Arterial scans. There are no restrictions or special instructions   Follow-Up: Your physician recommends that you schedule a follow-up appointment in: Pending Results  Any Other Special Instructions Will Be Listed Below (If Applicable). Thank you for choosing [facility]!     If you need a refill on your cardiac medications before your next appointment, please call your pharmacy.

## 2022-12-20 DIAGNOSIS — E785 Hyperlipidemia, unspecified: Secondary | ICD-10-CM | POA: Insufficient documentation

## 2022-12-20 DIAGNOSIS — M79606 Pain in leg, unspecified: Secondary | ICD-10-CM | POA: Insufficient documentation

## 2022-12-20 NOTE — Progress Notes (Signed)
Cardiology Office Note  Date: 12/20/2022   ID: Cassie, Johnson [birth year], MRN [id number]  PCP:  [facility]  Cardiologist:  Marjo Bicker, MD Electrophysiologist:  None   History of Present Illness: Cassie Johnson is a 77 y.o. female known to have HTN, DM 2, HLD, Hashimoto's thyroiditis was referred to cardiology clinic for evaluation of SOB.  Patient is physically active at baseline, METs more than 4, goes to gym 3 times a week, works on the elliptical, performs squats and lifts weights.  Patient was sent to ER in 10/2022 for abnormal EKG, I reviewed the EKG on the EMR that showed NSR and RBBB.  EKG today in the clinic showed NSR, resolution of RBBB.  No ischemia.  Patient's main complaint was pain from the groin to her knee bilaterally when she walks but she has no problem performing the above exercises in the gym.  She is unsure if it is her joints or she had a blockage in her legs.  In addition, when she walks with pain from her groin to her knee bilaterally, she also feels tired and gets little out of breath.  Otherwise no orthopnea, PND, leg swelling.  No angina, dizziness, presyncope and syncope.  Past Medical History:  Diagnosis Date   High blood pressure    High cholesterol    Reflux    Urticaria     Past Surgical History:  Procedure Laterality Date   ABDOMINAL HYSTERECTOMY     CARPAL TUNNEL RELEASE     CHOLECYSTECTOMY     VESICOVAGINAL FISTULA CLOSURE W/ TAH      Current Outpatient Medications  Medication Sig Dispense Refill   allopurinol (ZYLOPRIM) 100 MG tablet Take 100 mg by mouth daily.     cetirizine (ZYRTEC) 10 MG tablet Take 10 mg by mouth 2 (two) times daily.     ezetimibe (ZETIA) 10 MG tablet Take 10 mg by mouth daily.     FLUTICASONE PROPIONATE NA Place into the nose.     latanoprost (XALATAN) 0.005 % ophthalmic solution Place 1 drop into both eyes daily at 12 noon.     lisinopril (ZESTRIL) 10 MG tablet Take 5 mg by  mouth daily with breakfast.     hydrochlorothiazide (HYDRODIURIL) 25 MG tablet Take 1 tablet by mouth daily. (Patient not taking: Reported on 12/19/2022)     metFORMIN (GLUCOPHAGE) 500 MG tablet Take 1 tablet (500 mg total) by mouth daily with breakfast. 30 tablet 0   Current Facility-Administered Medications  Medication Dose Route Frequency Provider Last Rate Last Admin   omalizumab Geoffry Paradise) injection 300 mg  300 mg Subcutaneous Q14 Days Alfonse Spruce, MD   300 mg at 05/20/19 1018   Allergies:  Prednisone, Statins, and Sulfa drugs cross reactors   Social History: The patient  reports that she has quit smoking. She has never used smokeless tobacco. She reports current alcohol use. She reports that she does not use drugs.   Family History: The patient's family history includes Allergic rhinitis in her sister; Heart failure in her mother; Hypertension in her mother.   ROS:  Please see the history of present illness. Otherwise, complete review of systems is positive for none  All other systems are reviewed and negative.   Physical Exam: VS:  BP (!) 162/84 (BP Location: Left Arm)   Pulse 68   Ht 5\' 2"  (1.575 m)   Wt 156 lb 6.4 oz (70.9 kg)   SpO2 98%  BMI 28.61 kg/m , BMI Body mass index is 28.61 kg/m.  Wt Readings from Last 3 Encounters:  12/19/22 156 lb 6.4 oz (70.9 kg)  11/13/22 155 lb (70.3 kg)  01/31/22 153 lb 12.8 oz (69.8 kg)    General: Patient appears comfortable at rest. HEENT: Conjunctiva and lids normal, oropharynx clear with moist mucosa. Neck: Supple, no elevated JVP or carotid bruits, no thyromegaly. Lungs: Clear to auscultation, nonlabored breathing at rest. Cardiac: Regular rate and rhythm, no S3 or significant systolic murmur, no pericardial rub. Abdomen: Soft, nontender, no hepatomegaly, bowel sounds present, no guarding or rebound. Extremities: No pitting edema Skin: Warm and dry. Musculoskeletal: No kyphosis. Neuropsychiatric: Alert and oriented x3,  affect grossly appropriate.  Recent Labwork: 01/25/2022: TSH 1.690 11/13/2022: BUN 30; Creatinine, Ser 1.38; Hemoglobin 14.7; Platelets 279; Potassium 3.9; Sodium 137  No results found for: "CHOL", "TRIG", "HDL", "CHOLHDL", "VLDL", "LDLCALC", "LDLDIRECT"    Assessment and Plan:  Leg pain: Patient has pain from the groin to her knee bilaterally when she walks associated with fatigue and some SOB.  No orthopnea or PND.  Will obtain ABIs with ultrasound arterial Doppler lower extremities.  HTN, controlled: Continue lisinopril 5 mg once daily.  Follows with PCP.  HLD: On Zetia 10 mg once daily, follows with PCP.   I spent a total duration of 45 minutes reviewing the prior ER notes, EKG, labs, face-to-face discussion/counseling of her medical condition, pathophysiology, evaluation, management and documenting the findings in the note.   Disposition:  Follow up  pending results  Signed Whisper Kurka Verne Spurr, MD, 12/20/2022 8:46 AM    [facility] [address] [place], [place], [place] [address]

## 2023-01-03 ENCOUNTER — Ambulatory Visit: Payer: Medicare Other

## 2023-01-03 ENCOUNTER — Ambulatory Visit: Payer: Medicare Other | Attending: Internal Medicine

## 2023-01-03 DIAGNOSIS — M79605 Pain in left leg: Secondary | ICD-10-CM

## 2023-01-03 DIAGNOSIS — M79606 Pain in leg, unspecified: Secondary | ICD-10-CM | POA: Diagnosis present

## 2023-01-03 DIAGNOSIS — M79604 Pain in right leg: Secondary | ICD-10-CM

## 2023-01-03 LAB — VAS US ABI WITH/WO TBI
Left ABI: 0.5
Right ABI: 0.68

## 2023-01-07 ENCOUNTER — Telehealth: Payer: Self-pay

## 2023-01-07 MED ORDER — CILOSTAZOL 50 MG PO TABS: 50.0000 mg | ORAL_TABLET | Freq: Two times a day (BID) | ORAL | 2 refills | Status: DC

## 2023-01-07 MED ORDER — ASPIRIN 81 MG PO TBEC: 81.0000 mg | DELAYED_RELEASE_TABLET | Freq: Every day | ORAL | 2 refills | Status: AC

## 2023-01-07 NOTE — Telephone Encounter (Signed)
-----   Message from Vishnu P Mallipeddi sent at 01/04/2023  4:21 PM EST ----- Moderate PAD of bilateral lower extremities. Ultrasound arterial Doppler lower cavity suggestive of more proximal stenosis.  Start aspirin 81 mg once daily, rosuvastatin 20 mg nightly.  Start cilostazol 50 mg twice daily for symptomatic improvement in the leg claudication.  Side effect includes palpitations.  Schedule follow-up appointment in 1 month.

## 2023-01-07 NOTE — Telephone Encounter (Signed)
Patient informed and verbalized understanding of plan. 

## 2023-01-29 ENCOUNTER — Telehealth: Payer: Self-pay | Admitting: "Endocrinology

## 2023-01-29 ENCOUNTER — Encounter: Payer: Self-pay | Admitting: Nurse Practitioner

## 2023-01-29 ENCOUNTER — Ambulatory Visit: Payer: Medicare Other | Attending: Nurse Practitioner | Admitting: Nurse Practitioner

## 2023-01-29 VITALS — BP 128/74 | HR 74 | Ht 62.0 in | Wt 149.0 lb

## 2023-01-29 DIAGNOSIS — Z789 Other specified health status: Secondary | ICD-10-CM

## 2023-01-29 DIAGNOSIS — E042 Nontoxic multinodular goiter: Secondary | ICD-10-CM

## 2023-01-29 DIAGNOSIS — I739 Peripheral vascular disease, unspecified: Secondary | ICD-10-CM

## 2023-01-29 DIAGNOSIS — I10 Essential (primary) hypertension: Secondary | ICD-10-CM

## 2023-01-29 DIAGNOSIS — E785 Hyperlipidemia, unspecified: Secondary | ICD-10-CM | POA: Diagnosis present

## 2023-01-29 DIAGNOSIS — R0989 Other specified symptoms and signs involving the circulatory and respiratory systems: Secondary | ICD-10-CM

## 2023-01-29 NOTE — Patient Instructions (Addendum)
Medication Instructions:  Your physician has recommended you make the following change in your medication:  Please stop cilostazol (PLETAL) 50 MG tablet   Labwork: None   Testing/Procedures: None   Follow-Up: Your physician recommends that you schedule a follow-up appointment in: 6 month   Any Other Special Instructions Will Be Listed Below (If Applicable).  If you need a refill on your cardiac medications before your next appointment, please call your pharmacy.

## 2023-01-29 NOTE — Telephone Encounter (Signed)
Can you update labs 

## 2023-01-29 NOTE — Telephone Encounter (Signed)
Order updated

## 2023-01-29 NOTE — Progress Notes (Signed)
Cardiology Office Note:  .   Date:  01/29/2023 ID:  Cassie Johnson, DOB 1945/11/02, MRN [id number] PCP: [facility]  [facility] Providers Cardiologist:  Marjo Bicker, MD    History of Present Illness: .   Cassie Johnson is a 77 y.o. female with a PMH of HTN, HLD, T2DM, shortness of breath and Hashimoto's thyroiditis, who presents today for scheduled follow-up.  Last seen by Dr. Jenene Slicker on December 19, 2022 for evaluation for shortness of breath.  Patient noted pain from groin to knee bilaterally when walking, however when performing exercises at the gym she denied any issues.  Patient noted feeling tired and a little out of breath when walking.  At baseline, she was very active.  ABIs were arranged and revealed moderate PAD of bilateral lower extremities-see full report below.  She was started on aspirin and rosuvastatin as well as Pletal.  Today she presents for 1 month follow-up.  She states she is not taking Pletal as she does not want to be on a blood thinner, presents for concerns regarding this.  She is not on statin therapy as she tells me she cannot tolerate statins.  She also wonders if some of her symptoms are due to sciatica along her left leg.  Discussed results of recent testing as noted below.  Continues to note leg pain along left groin area to knee.  Denies any chest pain, shortness of breath, palpitations, syncope, presyncope, dizziness, orthopnea, PND, swelling or significant weight changes, acute bleeding.  ROS: Active.  See HPI.  Studies Reviewed: Marland Kitchen    Vascular ultrasound lower extremity arterial bilateral 12/2022: Summary:  Right: Large atherosclerotic plaque noted throughout with monophasic  doppler waveforms, suggestive of a significant more proximal stenosis.   Left: Large atherosclerotic plaque noted throughout with monophasic  doppler waveforms, suggestive of a significant more proximal stenosis.     See table(s)  above for measurements and observations.   Suggest follow up study in 12 months.  ABIs 12/2022: Summary:  Right: Resting right ankle-brachial index indicates moderate right lower  extremity arterial disease. The right toe-brachial index is abnormal.   Left: Resting left ankle-brachial index indicates moderate left lower  extremity arterial disease. The left toe-brachial index is abnormal.    *See table(s) above for measurements and observations.    Suggest follow up study in 12 months.  Physical Exam:   VS:  BP 128/74   Pulse 74   Ht 5\' 2"  (1.575 m)   Wt 149 lb (67.6 kg)   SpO2 99%   BMI 27.25 kg/m    Wt Readings from Last 3 Encounters:  01/29/23 149 lb (67.6 kg)  12/19/22 156 lb 6.4 oz (70.9 kg)  11/13/22 155 lb (70.3 kg)    GEN: Well nourished, well developed in no acute distress NECK: No JVD; carotid bruit, no left carotid bruit CARDIAC: S1/S2, RRR, no murmurs, rubs, gallops RESPIRATORY:  Clear to auscultation without rales, wheezing or rhonchi  ABDOMEN: Soft, non-tender, non-distended EXTREMITIES:  No edema; No deformity   ASSESSMENT AND PLAN: .    1.  PAD Continues to note left upper leg pain, wonders if she also has sciatica that may be contributing to her symptoms.  She expresses concern with previous Pletal that was prescribed, says she is not taking this and refuses to take this.  Continue aspirin and Zetia.  She cannot tolerate statins.  Discussed PCSK9 inhibitors, and she expresses some concerns regarding these medications.  Offered to arrange blood work, however patient defers this to her PCP.  Recommend FLP and CMET are obtained with PCP at next office visit. Heart healthy diet and regular cardiovascular exercise encouraged.  Pamphlet given regarding PAD.  Care and ED precautions discussed.  2.  Hypertension Blood pressure is stable and at goal. Discussed to monitor BP at home at least 2 hours after medications and sitting for 5-10 minutes.  Continue  lisinopril. Heart healthy diet and regular cardiovascular exercise encouraged.   3.  Hyperlipidemia, statin intolerance No recent lipid panel on file.  Offered/recommended to obtain lab work, however patient declines and defers this to her PCP.  Recommend that a FLP/CMET are obtained with PCP and results are faxed to our office.  Continue current medication regimen. Heart healthy diet and regular cardiovascular exercise encouraged.  Discussed lipid clinic referral and PCSK9 inhibitors, patient will like to think about this.  Pamphlet was given regarding PAD-see above.  ED precautions discussed.  4.  Right carotid bruit Recommended for carotid duplex to be performed to evaluate for carotid artery disease, however patient would like to know if this can be done along with her thyroid ultrasound with her endocrinologist.  Will check with endocrinologist regarding this request.   Dispo: Recommended sooner follow-up, however patient declines.  Patient is requesting to follow-up in 6 months.  Follow-up with Dr. Jenene Slicker or APP in 6 months or sooner if anything changes.  Signed, Sharlene Dory, NP

## 2023-01-30 LAB — TSH: TSH: 1.61 u[IU]/mL (ref 0.450–4.500)

## 2023-01-30 LAB — T4, FREE: Free T4: 1.23 ng/dL (ref 0.82–1.77)

## 2023-01-30 LAB — T3, FREE: T3, Free: 2.7 pg/mL (ref 2.0–4.4)

## 2023-02-05 ENCOUNTER — Ambulatory Visit (INDEPENDENT_AMBULATORY_CARE_PROVIDER_SITE_OTHER): Payer: Medicare Other | Admitting: "Endocrinology

## 2023-02-05 ENCOUNTER — Encounter: Payer: Self-pay | Admitting: "Endocrinology

## 2023-02-05 VITALS — BP 132/58 | HR 64 | Ht 62.0 in | Wt 152.8 lb

## 2023-02-05 DIAGNOSIS — E042 Nontoxic multinodular goiter: Secondary | ICD-10-CM

## 2023-02-05 DIAGNOSIS — E119 Type 2 diabetes mellitus without complications: Secondary | ICD-10-CM

## 2023-02-05 DIAGNOSIS — I10 Essential (primary) hypertension: Secondary | ICD-10-CM

## 2023-02-05 LAB — POCT GLYCOSYLATED HEMOGLOBIN (HGB A1C): HbA1c, POC (controlled diabetic range): 5.9 % (ref 0.0–7.0)

## 2023-02-05 NOTE — Progress Notes (Signed)
02/05/2023, 10:28 AM   Endocrinology follow-up note  Subjective:    Patient ID: Cassie Johnson, female    DOB: Nov 08, 1945, PCP Alvina Filbert, MD   Past Medical History:  Diagnosis Date   High blood pressure    High cholesterol    Reflux    Urticaria    Past Surgical History:  Procedure Laterality Date   ABDOMINAL HYSTERECTOMY     CARPAL TUNNEL RELEASE     CHOLECYSTECTOMY     VESICOVAGINAL FISTULA CLOSURE W/ TAH     Social History   Socioeconomic History   Marital status: Widowed    Spouse name: Not on file   Number of children: Not on file   Years of education: Not on file   Highest education level: Not on file  Occupational History   Not on file  Tobacco Use   Smoking status: Former   Smokeless tobacco: Never  Vaping Use   Vaping status: Never Used  Substance and Sexual Activity   Alcohol use: Yes   Drug use: No   Sexual activity: Not on file  Other Topics Concern   Not on file  Social History Narrative   Not on file   Social Determinants of Health   Financial Resource Strain: Not on file  Food Insecurity: Not on file  Transportation Needs: Not on file  Physical Activity: Not on file  Stress: Not on file  Social Connections: Not on file   Family History  Problem Relation Age of Onset   Allergic rhinitis Sister    Hypertension Mother    Heart failure Mother    Asthma Neg Hx    Eczema Neg Hx    Urticaria Neg Hx    Angioedema Neg Hx    Atopy Neg Hx    Immunodeficiency Neg Hx    Outpatient Encounter Medications as of 02/05/2023  Medication Sig   allopurinol (ZYLOPRIM) 100 MG tablet Take 100 mg by mouth daily.   aspirin EC 81 MG tablet Take 1 tablet (81 mg total) by mouth daily. Swallow whole.   cetirizine (ZYRTEC) 10 MG tablet Take 10 mg by mouth 2 (two) times daily.   ezetimibe (ZETIA) 10 MG tablet Take 10 mg by mouth daily.   FLUTICASONE PROPIONATE NA Place into the nose.   latanoprost  (XALATAN) 0.005 % ophthalmic solution Place 1 drop into both eyes daily at 12 noon.   lisinopril (ZESTRIL) 10 MG tablet Take 5 mg by mouth daily with breakfast.   [DISCONTINUED] metFORMIN (GLUCOPHAGE) 500 MG tablet Take 1 tablet (500 mg total) by mouth daily with breakfast. (Patient not taking: Reported on 02/05/2023)   Facility-Administered Encounter Medications as of 02/05/2023  Medication   omalizumab Geoffry Paradise) injection 300 mg   ALLERGIES: Allergies  Allergen Reactions   Prednisone    Statins    Sulfa Drugs Cross Reactors     VACCINATION STATUS:  There is no immunization history on file for this patient.  HPI Cassie Johnson is 77 y.o. female who presents today with a medical history as above. she is being seen in follow-up after she was seen consultation for multinodular goiter requested by Alvina Filbert, MD. -See notes from prior visits.   -During  her previous visits she was sent for a biopsy of left-sided thyroid nodule with benign findings.  She is returning with repeat thyroid function test, however did not believe that surveillance thyroid ultrasound which was ordered during her last visit.  She has no new complaints today.    Patient denies any family history of thyroid malignancy, however she reports that her mother was diagnosed with unidentified lymphoma.  Her medical records reveals antithyroid antibodies including TPO and thyroglobulin antibodies elevated in May 2020.  Her previsit thyroid function tests are still consistent with euthyroid state.  She is not on antithyroid medications nor thyroid hormone replacement.     She denies palpitations, tremors. She denies dysphagia, shortness of breath, nor voice change. She denies any exposure to neck radiation. -She was diagnosed with type 2 diabetes prior to her last visit.  She did not continue on metformin.  Her point-of-care A1c is 5.9%.     She is on amlodipine, hydrochlorothiazide, and lisinopril for blood pressure  management.  She complains of dropping blood pressure at home and she registered blood pressure 132/58  mmHg in the clinic today.     Review of Systems    Objective:       02/05/2023    9:46 AM 02/05/2023    9:21 AM 01/29/2023   12:58 PM  Vitals with BMI  Height  5\' 2"  5\' 2"   Weight  152 lbs 13 oz 149 lbs  BMI  27.94 27.25  Systolic 132 140 161  Diastolic 58 66 74  Pulse  64 74    BP (!) 132/58 Comment: L arm with manual cuff  Pulse 64   Ht 5\' 2"  (1.575 m)   Wt 152 lb 12.8 oz (69.3 kg)   BMI 27.95 kg/m   Wt Readings from Last 3 Encounters:  02/05/23 152 lb 12.8 oz (69.3 kg)  01/29/23 149 lb (67.6 kg)  12/19/22 156 lb 6.4 oz (70.9 kg)    Physical Exam  Constitutional:  Body mass index is 27.95 kg/m.,  not in acute distress, normal state of mind Eyes: PERRLA, EOMI, no exophthalmos ENT: moist mucous membranes, + gross thyromegaly, + palpable firm right submandibular gland, enlarged soft to firm right submandibular gland, + documented cervical lymphadenopathy bilaterally on ultrasound.      Lab Results  Component Value Date   TSH 1.610 01/29/2023   TSH 1.690 01/25/2022   TSH 1.430 09/22/2020   TSH 1.680 08/07/2019   TSH 1.800 09/30/2018   FREET4 1.23 01/29/2023   FREET4 1.27 01/25/2022   FREET4 1.29 09/22/2020   FREET4 1.09 08/07/2019       Ultrasound of neck/soft tissue on August 04, 2019: Prominent heterogeneous submandibular glands with prominent bilateral submandibular lymph nodes with normal-appearing morphology.  This could be secondary to inflammation or infection.  No evidence of sialolithiasis or dilated ducts.  Dedicated thyroid ultrasound from [facility] imaging on August 24, 2019: Right lobe 5.3 x 2.4 x 1.8 cm, left lobe 5.2 x 2.2 x 2.4 cm.  There is diffuse heterogeneous appearance of the thyroid gland.  There is a questionable dominant left mid gland nodule measuring 2.8 cm.  It appears oval in shape, smoothly marginated, hypoechoic with scattered  vascularity.  No central constipation.  Additional similar-appearing nodules are present bilaterally, lobe smaller.  Impression: Questionable multinodular thyroid with low suspicion for nodules.   Fine-needle aspiration on September 09, 2019 Clinical History: 2.8 cm LML  Specimen Submitted:  A. THYROID GLAND, LEFT, FINE NEEDLE ASPIRATION:  FINAL MICROSCOPIC DIAGNOSIS:  - Consistent with benign follicular nodule (Bethesda category II)   SPECIMEN ADEQUACY:  Satisfactory for evaluation   Repeat surveillance thyroid ultrasound on September 29, 2020: Right lobe measures 4.8 cm, left lobe measures 5.2 cm.  3 nodules.  Right inferior 1.6 cm, 1 year follow-up recommended.  Left medial 2.7 cm previously biopsied with benign outcomes.  Nodule 3 in the left inferior 1.2 cm, 1 year follow-up recommended.   Her repeat surveillance thyroid/neck ultrasound on January 25, 2022: IMPRESSION: 1. Multinodular thyroid gland.  No new or enlarging thyroid nodules. 2. Similar appearance of previously biopsied nodule labeled 2 in the mid left thyroid lobe. Correlate with biopsy results. 3. Nodule labeled 1 in the inferior right thyroid lobe continues to meet criteria for follow-up ultrasound in 1 year. This exam demonstrates 2 year stability. A total follow-up interval of 5 years is recommended.   Assessment & Plan:   1. Multinodular goiter-stable 2. Hashimoto's thyroiditis 3.  Type 2 diabetes 4.  Hypertension  -Prior to her last visit, biopsy result of her dominant thyroid nodule was reported to be negative.  Her repeat, surveillance thyroid ultrasound from November 2023 reveals stable/unremarkable findings.  However, she will need a follow-up ultrasound before her next visit.    Patient with Hashimoto's thyroiditis on the background, and family history of lymphoma, needs documentation of 5-year stability of thyroid nodules.   -She is euthyroid clinically and biochemically.  She would not need intervention  with thyroid hormone nor antithyroid medications at this time.    Regarding her recent diagnosis of type 2 diabetes with point-of-care A1c of 5.9%.  She did not continue on metformin and would like to avoid for now.   Whole food plant-based diet and other lifestyle medicine pillars were discussed in detail with her.  She is advised to continue lisinopril 5 mg p.o. daily at breakfast .  I did not initiate any new prescriptions today.   - she is advised to maintain close follow up with Alvina Filbert, MD for primary care needs.   I spent  22  minutes in the care of the patient today including review of labs from Thyroid Function, CMP, and other relevant labs ; imaging/biopsy records (current and previous including abstractions from other facilities); face-to-face time discussing  her lab results and symptoms, medications doses, her options of short and long term treatment based on the latest standards of care / guidelines;   and documenting the encounter.  Cassie Johnson  participated in the discussions, expressed understanding, and voiced agreement with the above plans.  All questions were answered to her satisfaction. she is encouraged to contact clinic should she have any questions or concerns prior to her return visit.    Follow up plan: Return in about 6 months (around 08/06/2023) for Thyroid / Neck Ultrasound, Fasting Labs  in AM B4 8, A1c -[place].   Marquis Lunch, MD [facility] [address] [place], [place] [address] Phone: [phone]  Fax: [phone]     02/05/2023, 10:28 AM  This note was partially dictated with voice recognition software. Similar sounding words can be transcribed inadequately or may not  be corrected upon review.

## 2023-02-05 NOTE — Patient Instructions (Signed)

## 2023-02-12 ENCOUNTER — Other Ambulatory Visit: Payer: Medicare Other

## 2023-02-12 ENCOUNTER — Ambulatory Visit (HOSPITAL_COMMUNITY)
Admission: RE | Admit: 2023-02-12 | Discharge: 2023-02-12 | Disposition: A | Discharge status: Home / Self Care | Payer: Medicare Other | Source: Ambulatory Visit | Attending: "Endocrinology | Admitting: "Endocrinology

## 2023-02-12 DIAGNOSIS — E042 Nontoxic multinodular goiter: Secondary | ICD-10-CM | POA: Insufficient documentation

## 2023-03-11 ENCOUNTER — Ambulatory Visit: Payer: Medicare Other | Admitting: Internal Medicine

## 2023-07-30 ENCOUNTER — Ambulatory Visit: Payer: Medicare Other | Admitting: Nurse Practitioner

## 2023-08-05 LAB — TSH: TSH: 1.5 u[IU]/mL (ref 0.450–4.500)

## 2023-08-05 LAB — LIPID PANEL
Chol/HDL Ratio: 6.8 ratio — ABNORMAL HIGH (ref 0.0–4.4)
Cholesterol, Total: 272 mg/dL — ABNORMAL HIGH (ref 100–199)
HDL: 40 mg/dL (ref >39)
LDL Chol Calc (NIH): 202 mg/dL — ABNORMAL HIGH (ref 0–99)
Triglycerides: 161 mg/dL — ABNORMAL HIGH (ref 0–149)
VLDL Cholesterol Cal: 30 mg/dL (ref 5–40)

## 2023-08-05 LAB — T4, FREE: Free T4: 1.24 ng/dL (ref 0.82–1.77)

## 2023-08-06 ENCOUNTER — Ambulatory Visit (INDEPENDENT_AMBULATORY_CARE_PROVIDER_SITE_OTHER): Payer: Medicare Other | Admitting: "Endocrinology

## 2023-08-06 ENCOUNTER — Encounter: Payer: Self-pay | Admitting: "Endocrinology

## 2023-08-06 VITALS — BP 130/58 | HR 68 | Ht 62.0 in | Wt 152.6 lb

## 2023-08-06 DIAGNOSIS — E042 Nontoxic multinodular goiter: Secondary | ICD-10-CM | POA: Diagnosis not present

## 2023-08-06 DIAGNOSIS — E782 Mixed hyperlipidemia: Secondary | ICD-10-CM | POA: Diagnosis not present

## 2023-08-06 DIAGNOSIS — I10 Essential (primary) hypertension: Secondary | ICD-10-CM

## 2023-08-06 DIAGNOSIS — E119 Type 2 diabetes mellitus without complications: Secondary | ICD-10-CM

## 2023-08-06 LAB — POCT GLYCOSYLATED HEMOGLOBIN (HGB A1C): HbA1c, POC (controlled diabetic range): 5.9 % (ref 0.0–7.0)

## 2023-08-06 NOTE — Progress Notes (Signed)
 08/06/2023, 11:27 AM   Endocrinology follow-up note  Subjective:    Patient ID: Cassie Johnson, female    DOB: [birth year], PCP Cassie Galen, MD   Past Medical History:  Diagnosis Date   High blood pressure    High cholesterol    Reflux    Urticaria    Past Surgical History:  Procedure Laterality Date   ABDOMINAL HYSTERECTOMY     CARPAL TUNNEL RELEASE     CHOLECYSTECTOMY     VESICOVAGINAL FISTULA CLOSURE W/ TAH     Social History   Socioeconomic History   Marital status: Widowed    Spouse name: Not on file   Number of children: Not on file   Years of education: Not on file   Highest education level: Not on file  Occupational History   Not on file  Tobacco Use   Smoking status: Former   Smokeless tobacco: Never  Vaping Use   Vaping status: Never Used  Substance and Sexual Activity   Alcohol use: Yes   Drug use: No   Sexual activity: Not on file  Other Topics Concern   Not on file  Social History Narrative   Not on file   Social Drivers of Health   Financial Resource Strain: Not on file  Food Insecurity: Not on file  Transportation Needs: Not on file  Physical Activity: Not on file  Stress: Not on file  Social Connections: Not on file   Family History  Problem Relation Age of Onset   Allergic rhinitis Sister    Hypertension Mother    Heart failure Mother    Asthma Neg Hx    Eczema Neg Hx    Urticaria Neg Hx    Angioedema Neg Hx    Atopy Neg Hx    Immunodeficiency Neg Hx    Outpatient Encounter Medications as of 08/06/2023  Medication Sig   allopurinol (ZYLOPRIM) 100 MG tablet Take 100 mg by mouth daily.   aspirin  EC 81 MG tablet Take 1 tablet (81 mg total) by mouth daily. Swallow whole.   cetirizine (ZYRTEC) 10 MG tablet Take 10 mg by mouth 2 (two) times daily.   ezetimibe (ZETIA) 10 MG tablet Take 10 mg by mouth daily.   FLUTICASONE PROPIONATE NA Place into the nose.   latanoprost  (XALATAN) 0.005 % ophthalmic solution Place 1 drop into both eyes daily at 12 noon.   lisinopril (ZESTRIL) 10 MG tablet Take 5 mg by mouth daily with breakfast.   Facility-Administered Encounter Medications as of 08/06/2023  Medication   omalizumab  (XOLAIR ) injection 300 mg   ALLERGIES: Allergies  Allergen Reactions   Prednisone    Statins    Sulfa Drugs Cross Reactors     VACCINATION STATUS:  There is no immunization history on file for this patient.  HPI Cassie Johnson is 78 y.o. female who presents today with a medical history as above. she is being seen in follow-up after she was seen consultation for multinodular goiter requested by Cassie Galen, MD. -See notes from prior visits.   -During her previous visits she was sent for a biopsy of left-sided thyroid  nodule with benign findings.  She is returning with repeat thyroid  function test.  She has no new complaints today.    Patient denies any family history of thyroid  malignancy, however she reports that her mother was diagnosed with unidentified lymphoma.  Her medical records reveals antithyroid antibodies including TPO and thyroglobulin antibodies elevated in May 2020 suggesting possible Hashimoto's thyroiditis on the background.  Her previsit thyroid  function tests are still consistent with euthyroid state.  She is not on antithyroid medications nor thyroid  hormone replacement.     She denies palpitations, tremors. She denies dysphagia, shortness of breath, nor voice change. She denies any exposure to neck radiation. -She was diagnosed with type 2 diabetes prior to her last visit.  She did not continue on metformin .  Her point-of-care A1c remains at 5.9%, wishes to avoid medications including metformin .      She is on amlodipine, hydrochlorothiazide, and lisinopril for blood pressure management.  She complains of dropping blood pressure at home and she registered blood pressure 132/58  mmHg in the clinic today.    Her  previsit labs show severe dyslipidemia with total cholesterol 272 and LDL of 202.  She reports that this was discussed by her PCP and was given an prescription for statin medication.  But she has not been taking it.   Review of Systems    Objective:       08/06/2023    9:53 AM 02/05/2023    9:46 AM 02/05/2023    9:21 AM  Vitals with BMI  Height 5\' 2"   5\' 2"   Weight 152 lbs 10 oz  152 lbs 13 oz  BMI 27.9  27.94  Systolic 130 132 161  Diastolic 58 58 66  Pulse 68  64    BP (!) 130/58   Pulse 68   Ht 5\' 2"  (1.575 m)   Wt 152 lb 9.6 oz (69.2 kg)   BMI 27.91 kg/m   Wt Readings from Last 3 Encounters:  08/06/23 152 lb 9.6 oz (69.2 kg)  02/05/23 152 lb 12.8 oz (69.3 kg)  01/29/23 149 lb (67.6 kg)    Physical Exam  Constitutional:  Body mass index is 27.91 kg/m.,  not in acute distress, normal state of mind Eyes: PERRLA, EOMI, no exophthalmos ENT: moist mucous membranes, + gross thyromegaly, + palpable firm right submandibular gland, enlarged soft to firm right submandibular gland, + documented cervical lymphadenopathy bilaterally on ultrasound.      Lab Results  Component Value Date   TSH 1.500 08/01/2023   TSH 1.610 01/29/2023   TSH 1.690 01/25/2022   TSH 1.430 09/22/2020   TSH 1.680 08/07/2019   TSH 1.800 09/30/2018   FREET4 1.24 08/01/2023   FREET4 1.23 01/29/2023   FREET4 1.27 01/25/2022   FREET4 1.29 09/22/2020   FREET4 1.09 08/07/2019       Ultrasound of neck/soft tissue on August 04, 2019: Prominent heterogeneous submandibular glands with prominent bilateral submandibular lymph nodes with normal-appearing morphology.  This could be secondary to inflammation or infection.  No evidence of sialolithiasis or dilated ducts.  Dedicated thyroid  ultrasound from [facility] imaging on August 24, 2019: Right lobe 5.3 x 2.4 x 1.8 cm, left lobe 5.2 x 2.2 x 2.4 cm.  There is diffuse heterogeneous appearance of the thyroid  gland.  There is a questionable dominant left mid  gland nodule measuring 2.8 cm.  It appears oval in shape, smoothly marginated, hypoechoic with scattered vascularity.  No central constipation.  Additional similar-appearing nodules are present bilaterally, lobe smaller.  Impression: Questionable multinodular thyroid  with low suspicion for nodules.  Fine-needle aspiration on September 09, 2019 Clinical History: 2.8 cm LML  Specimen Submitted:  A. THYROID  GLAND, LEFT, FINE NEEDLE ASPIRATION:  FINAL MICROSCOPIC DIAGNOSIS:  - Consistent with benign follicular nodule ([facility]hes[employer] category II)   SPECIMEN ADEQUACY:  Satisfactory for evaluation   Repeat surveillance thyroid  ultrasound on September 29, 2020: Right lobe measures 4.8 cm, left lobe measures 5.2 cm.  3 nodules.  Right inferior 1.6 cm, 1 year follow-up recommended.  Left medial 2.7 cm previously biopsied with benign outcomes.  Nodule 3 in the left inferior 1.2 cm, 1 year follow-up recommended.   Her repeat surveillance thyroid /neck ultrasound on January 25, 2022: IMPRESSION: 1. Multinodular thyroid  gland.  No new or enlarging thyroid  nodules. 2. Similar appearance of previously biopsied nodule labeled 2 in the mid left thyroid  lobe. Correlate with biopsy results. 3. Nodule labeled 1 in the inferior right thyroid  lobe continues to meet criteria for follow-up ultrasound in 1 year. This exam demonstrates 2 year stability. A total follow-up interval of 5 years is recommended.   Assessment & Plan:   1. Multinodular goiter-stable 2. Hashimoto's thyroiditis 3.  Type 2 diabetes 4.  Hypertension  -Prior to her last visit, biopsy result of her dominant thyroid  nodule was reported to be negative.  Her repeat, surveillance thyroid  ultrasound from November 2023 reveals stable/unremarkable findings.  However, she will need a follow-up thyroid /neck ultrasound for surveillance due to the fact that she has Hashimoto's background in the family history of lymphoma.  Her multinodular goiter needs  documentation of 5-year stability of thyroid  nodules.   -She is euthyroid clinically and biochemically.  She would not need intervention with thyroid  hormone nor antithyroid medications at this time.    Regarding her recent diagnosis of type 2 diabetes with point-of-care A1c of 5.9%, she wishes to avoid medications, would like to stay away from metformin  for now.     Whole food plant-based diet and other lifestyle medicine pillars were discussed in detail with her. She did not engage actively with this nutrition, presenting with severe dyslipidemia with LDL of 202.  I had a long discussion about risk of uncontrolled dyslipidemia causing cardiovascular disease.  She is encouraged to start her statin prescribed by her PCP.  She is advised to continue lisinopril 5 mg p.o. daily at breakfast .  I did not initiate any new prescriptions today.   - she is advised to maintain close follow up with Cassie Galen, MD for primary care needs.   I spent  25  minutes in the care of the patient today including review of labs from Thyroid  Function, CMP, and other relevant labs ; imaging/biopsy records (current and previous including abstractions from other facilities); face-to-face time discussing  her lab results and symptoms, medications doses, her options of short and long term treatment based on the latest standards of care / guidelines;   and documenting the encounter.  Charlett B Marseille  participated in the discussions, expressed understanding, and voiced agreement with the above plans.  All questions were answered to her satisfaction. she is encouraged to contact clinic should she have any questions or concerns prior to her return visit.     Follow up plan: Return in about 1 year (around 08/05/2024) for Fasting Labs  in AM B4 8, Thyroid  / Neck Ultrasound.   Kalvin Orf, MD [facility] [address] [place], [place] [address] Phone: [phone]   Fax: [phone]     08/06/2023, 11:27 AM  This note was  partially dictated with voice recognition software. Similar sounding words can be transcribed inadequately or may not  be corrected upon review.

## 2023-09-19 ENCOUNTER — Other Ambulatory Visit (HOSPITAL_COMMUNITY): Payer: Self-pay | Admitting: Nurse Practitioner

## 2023-09-19 DIAGNOSIS — M5442 Lumbago with sciatica, left side: Secondary | ICD-10-CM

## 2023-09-24 ENCOUNTER — Ambulatory Visit (HOSPITAL_COMMUNITY)
Admission: RE | Admit: 2023-09-24 | Discharge: 2023-09-24 | Disposition: A | Discharge status: Home / Self Care | Source: Ambulatory Visit | Attending: Nurse Practitioner | Admitting: Nurse Practitioner

## 2023-09-24 DIAGNOSIS — M5442 Lumbago with sciatica, left side: Secondary | ICD-10-CM | POA: Diagnosis present

## 2024-07-28 ENCOUNTER — Other Ambulatory Visit (HOSPITAL_COMMUNITY)

## 2024-08-06 ENCOUNTER — Ambulatory Visit: Admitting: "Endocrinology
